# Patient Record
Sex: Female | Born: 1940 | Race: Black or African American | Hispanic: No | Marital: Single | Smoking: Never smoker
Health system: Southern US, Community
[De-identification: ages and names within clinical notes are randomized; demographics above are authoritative.]

## PROBLEM LIST (undated history)

## (undated) DIAGNOSIS — L732 Hidradenitis suppurativa: Secondary | ICD-10-CM

## (undated) DIAGNOSIS — I10 Essential (primary) hypertension: Secondary | ICD-10-CM

## (undated) DIAGNOSIS — K219 Gastro-esophageal reflux disease without esophagitis: Secondary | ICD-10-CM

## (undated) DIAGNOSIS — U071 COVID-19: Secondary | ICD-10-CM

## (undated) DIAGNOSIS — I5032 Chronic diastolic (congestive) heart failure: Secondary | ICD-10-CM

## (undated) DIAGNOSIS — J9621 Acute and chronic respiratory failure with hypoxia: Secondary | ICD-10-CM

## (undated) DIAGNOSIS — D62 Acute posthemorrhagic anemia: Secondary | ICD-10-CM

## (undated) DIAGNOSIS — I2510 Atherosclerotic heart disease of native coronary artery without angina pectoris: Secondary | ICD-10-CM

## (undated) DIAGNOSIS — J8410 Pulmonary fibrosis, unspecified: Secondary | ICD-10-CM

## (undated) DIAGNOSIS — E782 Mixed hyperlipidemia: Secondary | ICD-10-CM

## (undated) HISTORY — DX: Hidradenitis suppurativa: L73.2

## (undated) HISTORY — DX: Essential (primary) hypertension: I10

## (undated) HISTORY — DX: Mixed hyperlipidemia: E78.2

## (undated) HISTORY — DX: Gastro-esophageal reflux disease without esophagitis: K21.9

## (undated) HISTORY — PX: NEPHRECTOMY: SHX65

## (undated) HISTORY — DX: Atherosclerotic heart disease of native coronary artery without angina pectoris: I25.10

## (undated) HISTORY — PX: ABDOMINAL HYSTERECTOMY: SHX81

## (undated) HISTORY — PX: LUMBAR SPINE SURGERY: SHX701

---

## 2007-03-13 ENCOUNTER — Ambulatory Visit (HOSPITAL_COMMUNITY): Admission: RE | Admit: 2007-03-13 | Discharge: 2007-03-13 | Payer: Self-pay | Admitting: Urology

## 2007-06-30 ENCOUNTER — Encounter: Admission: RE | Admit: 2007-06-30 | Discharge: 2007-06-30 | Payer: Self-pay | Admitting: Surgery

## 2007-08-10 ENCOUNTER — Encounter (INDEPENDENT_AMBULATORY_CARE_PROVIDER_SITE_OTHER): Payer: Self-pay | Admitting: Surgery

## 2007-08-10 ENCOUNTER — Ambulatory Visit (HOSPITAL_BASED_OUTPATIENT_CLINIC_OR_DEPARTMENT_OTHER): Admission: RE | Admit: 2007-08-10 | Discharge: 2007-08-10 | Payer: Self-pay | Admitting: Surgery

## 2007-12-07 ENCOUNTER — Ambulatory Visit (HOSPITAL_COMMUNITY): Admission: RE | Admit: 2007-12-07 | Discharge: 2007-12-07 | Payer: Self-pay | Admitting: Emergency Medicine

## 2007-12-07 ENCOUNTER — Emergency Department (HOSPITAL_COMMUNITY): Admission: EM | Admit: 2007-12-07 | Discharge: 2007-12-07 | Payer: Self-pay | Admitting: Emergency Medicine

## 2007-12-09 ENCOUNTER — Ambulatory Visit (HOSPITAL_COMMUNITY): Admission: RE | Admit: 2007-12-09 | Discharge: 2007-12-09 | Payer: Self-pay | Admitting: Orthopedic Surgery

## 2007-12-10 ENCOUNTER — Encounter: Admission: RE | Admit: 2007-12-10 | Discharge: 2007-12-10 | Payer: Self-pay | Admitting: Orthopedic Surgery

## 2007-12-24 ENCOUNTER — Encounter: Admission: RE | Admit: 2007-12-24 | Discharge: 2007-12-24 | Payer: Self-pay | Admitting: Orthopedic Surgery

## 2008-01-07 ENCOUNTER — Inpatient Hospital Stay (HOSPITAL_COMMUNITY): Admission: RE | Admit: 2008-01-07 | Discharge: 2008-01-09 | Payer: Self-pay | Admitting: Otolaryngology

## 2008-03-07 ENCOUNTER — Ambulatory Visit (HOSPITAL_COMMUNITY): Admission: RE | Admit: 2008-03-07 | Discharge: 2008-03-07 | Payer: Self-pay | Admitting: Specialist

## 2008-03-07 ENCOUNTER — Encounter (INDEPENDENT_AMBULATORY_CARE_PROVIDER_SITE_OTHER): Payer: Self-pay | Admitting: Specialist

## 2008-03-07 ENCOUNTER — Ambulatory Visit: Payer: Self-pay | Admitting: Vascular Surgery

## 2010-12-09 ENCOUNTER — Encounter: Payer: Self-pay | Admitting: Urology

## 2011-02-28 ENCOUNTER — Encounter: Payer: Self-pay | Admitting: Cardiology

## 2011-03-19 ENCOUNTER — Encounter: Payer: Self-pay | Admitting: Cardiology

## 2011-03-20 ENCOUNTER — Encounter: Payer: Self-pay | Admitting: Cardiology

## 2011-03-26 ENCOUNTER — Encounter: Payer: Self-pay | Admitting: Cardiology

## 2011-04-02 NOTE — Op Note (Signed)
NAME:  [name], [name] NO.:  [id number]   MEDICAL RECORD NO.:  [id number]          PATIENT TYPE:  OIB   LOCATION:  5019                         FACILITY:  [facility]   PHYSICIAN:  Kerrin Champagne, M.D.   DATE OF BIRTH:  04/26/1941   DATE OF PROCEDURE:  01/07/2008  DATE OF DISCHARGE:                               OPERATIVE REPORT   PREOPERATIVE DIAGNOSIS:  Herniated nucleus pulposus right foraminal  extraforaminal affecting primarily the right L5 nerve root with extruded  fragments.   POSTOPERATIVE DIAGNOSIS:  Right L5 foraminal entrapment with disk  degeneration and direct decompression of extraforaminal herniated  nucleus pulposus.   PROCEDURE:  Right L5 S1 microdiskectomy with partial medial facetectomy  L5 S1 and decompression of the L5 nerve root with diskectomy right L5 S1  foraminal extraforaminal.   SURGEON:  Vira Browns, MD.   ASSISTANT:  Maud Deed, PA-C.   ANESTHESIA:  General via orotracheal intubation, Dr. Diamantina Monks,  supplemented with local infiltration of Marcaine 0.5% with 1:200,000  epinephrine 10 mL.   ESTIMATED BLOOD LOSS:  Less than 25 mL.   DRAINS:  None.   BRIEF CLINICAL HISTORY:  This patient is a 70 year old female with a  greater than 4 week history of back pain.  She indicates her pain began  nearly 4 months ago and has been present since then.  Then with severe  worsening of discomfort into the right lower extremity over the last 3  weeks she underwent extensive evaluation, MRI study which demonstrated  disk protrusions in multiple areas, left L4-5, right L3-4 and right L5  S1.  Foraminal stenosis right L5 S1 with disc protrusion at L3-4.  The  pain pattern is primarily L5, radiation on lateral thigh, lateral calf  and into the right foot with attendant numbness and anesthesia.  Patient  has history of renal carcinoma as well as bladder carcinoma.  She has  been treated by Dr. Annabell Howells.  Also with a history of heart disease,  cardiologist she sees, estimated ejection fraction of 55%.  She is  brought to the operating room after undergoing attempts at conservative  management including epidural steroids x2 without significant relief of  pain.  Did, however, provoke her discomfort and relieve her pain with  ESI via transforaminal approach at the L5 S1 level on the right side.  She is requiring significant amounts of narcotic medicines to relieve  her pain, unable to sleep, unable to stand, ambulate or walk to any  large extent.  She is being brought to the operating room with  recalcitrant pain secondary to foraminal extraforaminal HNP right L5 S1  with right L5 radiculopathy.   DESCRIPTION OF PROCEDURE:  After adequate general anesthesia the patient  was then placed onto the [facility] spine table, all pads were carefully  adjusted and the arms at the sides 90 from the shoulders and the elbow.  She had standard preoperative antibiotics of Ancef, standard preparation  with DuraPrep solution over the midline, was draped in the usual manner.  An incision approximately 2.5 inches in length in the midline at the  expected lumbosacral junction.  The  incision was then carried through  the skin, the subcutaneous layers directly down to the lumbodorsal  fascia in the midline.  Incision then made along the right side of the  spinous process at the expected L5 and S1 level and clamp placed on the  spinous process visible.  The intraoperative lateral radiograph  demonstrated a clamp on the S1 spinous process.  An incision in the  lumbodorsal fascia was then carried up an additional one level on the  right side.  Electrocautery then used to carefully incise the  paraspinous muscles off of the lateral aspect of the spinous process of  L5 and S1 and off the posterior aspect of lamina of L5 and S1 on the  right side.  Boss McCullough retractors was inserted for the blade  lateral and the post medial.  Loop magnification and head  lamp used  during this portion of the procedure.  Leksell rongeur then used to  remove inferior portion of the right side lamina of L5.  This was  removed upwards a good 5 to 6 mm and a 3 mm Kerrison then introduced to  resect further from the inferior aspect from lamina of L5 on the right  side to the area of release of the ligamentum flavum on this side.  A  nerve hook then used to elevate or lift the ligamentum flavum and  ligamentum flavum was then resected using 3 and 4 mm Kerrisons.  The  ligamentum flavum resected off the medial aspect of the facet on the  right side of the expected L5 S1 level.  Then off the superior aspect of  the lamina of S1 on the right side.  Foraminotomy performed over the  right S1 nerve root and the medial aspect of the S1 pedicle identified.  A hockey stick neuroprobe was then passed over the superior aspect of  the pedicle of S1 and intraoperative lateral radial radiograph obtained  demonstrating the hockey stick neuroprobe overlying the posterior aspect  of the disk space of the L5 S1 level above the pedicle of S1 or at the  level of pedicle of S1.  A high speed bur then used to carefully thin  the medial aspect of the inferior articular process of L5, resecting  approximately 25% down to the level of superior articular process of S1.  This was then resected using two 3 mm Kerrisons again over about a 25 to  20% portion of the medial aspect of facet, resecting the reflected  portion of ligament flavum extending superiorly.  The L5 nerve root was  then decompressed within the neuroforamen.  The thecal sac able to be  retracted medially and the 5 nerve root identified superiorly, S1 nerve  root identified inferiorly.  Some veins felt to be tethering the L5  nerve root to the lateral portion of the patient's ventral aspect spinal  canal medial to the S1 pedicle were identified, these were then  carefully lifted with a nerve hook, cauterized using bipolar   electrocautery and then divided with a 15-blade scalpel, releasing the  L5 nerve root and also allowing for identification of the disk at the L5  S1 level on the right side superior to the pedicle of S1.  Carefully  then, examination was carried out of the lateral aspect of the disk, the  neuroforamen noted to be extremely tight continuing out laterally  lateral to the disk at the L5 S1 level.  This being the case, then a 15-  blade  scalpel was used to incise the disk, this was done after first  draping the operating room microscope sterilely and bringing it into the  field.  Under the operating room microscope then we were able to  visualize the neuroforamen quite nicely as well as the disk on the right  side, released the tethering veins that were present and a 15-blade  scalpel used to incise the disk.  The disk was then incised in the  subligamentous region as well as extending out laterally in the angular  region, removing a fair amount of nucleus pulposus material that was  extending laterally.  After completion of the excision of the disk then  further probing of the neuroforamen using a hockey stick neuroprobe as  well as a blunt-tip nerve hook introduced further disk material into the  area of opening annulotomy and further disk material was then resected.  This was continued until hockey stick neuroprobe was able to be passed  along the L5 nerve root and its course without any further sign of nerve  root compression.  The annulotomy in the disk space was at the level of  the superior pedicle of S1.  Irrigation was carried out.  It was felt  that no further decompression could be carried out at this point short  of a full resection of the patient's right-sided L5 S1 facet which was  not necessary as it was felt that the nerve root was well decompressed.  Irrigation was then carried out, thrombin-soaked Gelfoam used to obtain  hemostasis then irrigation.  Soft tissues allowed to  fall back into  place.  The lumbodorsal fascia reapproximated in the midline with  interrupted #0 Vicryl sutures, deep subcu layers approximated with  interrupted #0 and #1 Vicryl, the more superficial layers with  interrupted #2-0 Vicryl sutures and skin closed with a running subcu  stitch of #4-0 Vicryl, Dermabond was applied.  A 4 x 4 was affixed to  the skin with Hypafix tape.  The patient had an area of skin tear that  appeared to be old within the gluteal crease, this measuring about 3 to  3.5 cm in length, showed areas of granulation present that suggested it  to be old.  This was dressed with an OpSite dressing to keep it  isolated from the patient's incision wound as well as from her groin  area.  All instrument, sponge counts were correct.  The patient was then  returned to a supine position, reactive, extubated, returned to the  recovery room in satisfactory condition.      Kerrin Champagne, M.D.  Electronically Signed     JEN/MEDQ  D:  01/07/2008  T:  01/08/2008  Job:  [id number]

## 2011-04-02 NOTE — Op Note (Signed)
NAME:  [name], [name] NO.:  [id number]   MEDICAL RECORD NO.:  [id number]          PATIENT TYPE:  AMB   LOCATION:  DSC                          FACILITY:  [facility]   PHYSICIAN:  Velora Heckler, MD      DATE OF BIRTH:  Apr 15, 1941   DATE OF PROCEDURE:  08/10/2007  DATE OF DISCHARGE:  08/10/2007                               OPERATIVE REPORT   PREOPERATIVE DIAGNOSIS:  Soft tissue mass, right supraclavicular fossa.   POSTOPERATIVE DIAGNOSIS:  Soft tissue mass, right supraclavicular fossa.   PROCEDURE:  Excision, soft tissue mass, right supraclavicular fossa (7 x  5 x 5 cm).   SURGEON:  Velora Heckler, MD, FACS   ANESTHESIA:  General per Zenon Mayo, MD   ESTIMATED BLOOD LOSS:  Minimal.   PREPARATION:  Betadine.   COMPLICATIONS:  None.   INDICATIONS:  The patient is a 70 year old black female from [place],  [place].  She presents with a soft tissue mass of the right  supraclavicular fossa which has been present for approximately 3 years.  However, in recent months it has increased in size significantly and  caused pain radiating into the base of the right neck.  She has noted  some weakness in the right hand.  The patient underwent MRI scan of the  neck at [facility].  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  It was just superior to  the brachial plexus.  She now comes to surgery for excision.   BODY OF REPORT:  Procedure was done in OR #3 at the [facility] surgery  center.  The patient is brought to the operating room, placed in a  supine position on the operating room table.  Following administration  of general anesthesia, the patient is positioned and then prepped and  draped in the usual strict aseptic fashion.  After ascertaining that an  adequate level of anesthesia had been obtained, a curvilinear incision  is made along a skin fold at the base of the right neck.  Incision is  made  for approximately 4 cm in length.  Dissection is carried into the  subcutaneous tissues.  External jugular veins are identified and  preserved.  Dissection is carried through the subcutaneous tissues and  just cephalad to the subclavian vein.  Mass is identified.  There  appears to be a tissue plane, which is easily dissected out with gentle  blunt dissection surrounding the mass.  However, the mass does attach  deeply to either the firs rib or the coracoid process.  With gentle  dissection and judicious use of the electrocautery and medium Ligaclips,  the mass is freed in its entirety and excised.  The entire mass is  submitted to pathology for review.  Hemostasis is obtained within the  wound.  No neurologic structures are encountered.  No significant  vascular structures are divided.  Good hemostasis is noted.  Subcutaneous tissues are closed with interrupted 3-0 Vicryl sutures.  Skin is anesthetized with local Marcaine anesthetic.  Skin is closed  with a running 4-  0 Vicryl subcuticular suture.  Wound is washed and dried and Benzoin and  Steri-Strips are applied.  Sterile dressings are applied.  The patient  is awakened from anesthesia and brought to the recovery room in stable  condition.  The patient tolerated the procedure well.      Velora Heckler, MD  Electronically Signed     TMG/MEDQ  D:  08/10/2007  T:  08/11/2007  Job:  [id number]   cc:   Excell Seltzer. Annabell Howells, M.D.

## 2011-08-09 LAB — DIFFERENTIAL
Eosinophils Relative: 4
Lymphocytes Relative: 40
Lymphs Abs: 1.5
Monocytes Absolute: 0.2
Monocytes Relative: 6

## 2011-08-09 LAB — COMPREHENSIVE METABOLIC PANEL
ALT: 26
AST: 16
Alkaline Phosphatase: 49
CO2: 27
Calcium: 9.2
Potassium: 4.3
Sodium: 141
Total Protein: 6.5

## 2011-08-09 LAB — URINALYSIS, ROUTINE W REFLEX MICROSCOPIC
Glucose, UA: NEGATIVE
Protein, ur: NEGATIVE
Specific Gravity, Urine: 1.029
pH: 6.5

## 2011-08-09 LAB — CBC
MCHC: 33.6
MCV: 89.2
Platelets: 238
WBC: 3.6 — ABNORMAL LOW

## 2011-08-29 LAB — DIFFERENTIAL
Eosinophils Relative: 4
Lymphocytes Relative: 51 — ABNORMAL HIGH
Lymphs Abs: 1.6
Neutrophils Relative %: 38 — ABNORMAL LOW

## 2011-08-29 LAB — BASIC METABOLIC PANEL
Chloride: 106
Creatinine, Ser: 0.83

## 2011-08-29 LAB — URINALYSIS, ROUTINE W REFLEX MICROSCOPIC
Ketones, ur: NEGATIVE
Nitrite: NEGATIVE
Specific Gravity, Urine: 1.02
pH: 5

## 2011-08-29 LAB — POCT HEMOGLOBIN-HEMACUE
Hemoglobin: 11.4 — ABNORMAL LOW
Operator id: 208731

## 2011-08-29 LAB — CBC
HCT: 34.2 — ABNORMAL LOW
Platelets: 227
WBC: 3.1 — ABNORMAL LOW

## 2020-02-02 ENCOUNTER — Other Ambulatory Visit (HOSPITAL_COMMUNITY): Payer: Medicare Other

## 2020-02-02 ENCOUNTER — Ambulatory Visit (HOSPITAL_COMMUNITY)
Admission: AD | Admit: 2020-02-02 | Discharge: 2020-02-02 | Disposition: A | Discharge status: Home / Self Care | Payer: Medicare PPO | Source: Other Acute Inpatient Hospital | Attending: Internal Medicine | Admitting: Internal Medicine

## 2020-02-02 ENCOUNTER — Inpatient Hospital Stay
Admission: RE | Admit: 2020-02-02 | Discharge: 2020-03-13 | Disposition: A | Discharge status: Skilled Nursing Facility | Payer: Medicare Other | Source: Ambulatory Visit | Attending: Internal Medicine | Admitting: Internal Medicine

## 2020-02-02 DIAGNOSIS — J8410 Pulmonary fibrosis, unspecified: Secondary | ICD-10-CM | POA: Diagnosis present

## 2020-02-02 DIAGNOSIS — Z95828 Presence of other vascular implants and grafts: Secondary | ICD-10-CM

## 2020-02-02 DIAGNOSIS — J9690 Respiratory failure, unspecified, unspecified whether with hypoxia or hypercapnia: Secondary | ICD-10-CM | POA: Insufficient documentation

## 2020-02-02 DIAGNOSIS — D62 Acute posthemorrhagic anemia: Secondary | ICD-10-CM | POA: Diagnosis present

## 2020-02-02 DIAGNOSIS — U071 COVID-19: Secondary | ICD-10-CM | POA: Diagnosis present

## 2020-02-02 DIAGNOSIS — R110 Nausea: Secondary | ICD-10-CM

## 2020-02-02 DIAGNOSIS — Z431 Encounter for attention to gastrostomy: Secondary | ICD-10-CM

## 2020-02-02 DIAGNOSIS — I5032 Chronic diastolic (congestive) heart failure: Secondary | ICD-10-CM | POA: Diagnosis present

## 2020-02-02 DIAGNOSIS — K567 Ileus, unspecified: Secondary | ICD-10-CM

## 2020-02-02 DIAGNOSIS — R109 Unspecified abdominal pain: Secondary | ICD-10-CM

## 2020-02-02 DIAGNOSIS — J9621 Acute and chronic respiratory failure with hypoxia: Secondary | ICD-10-CM | POA: Diagnosis present

## 2020-02-02 DIAGNOSIS — Z4659 Encounter for fitting and adjustment of other gastrointestinal appliance and device: Secondary | ICD-10-CM

## 2020-02-02 HISTORY — DX: Acute posthemorrhagic anemia: D62

## 2020-02-02 HISTORY — DX: Acute and chronic respiratory failure with hypoxia: J96.21

## 2020-02-02 HISTORY — DX: Chronic diastolic (congestive) heart failure: I50.32

## 2020-02-02 HISTORY — DX: COVID-19: U07.1

## 2020-02-02 HISTORY — DX: Pulmonary fibrosis, unspecified: J84.10

## 2020-02-03 LAB — CBC
HCT: 28.4 % — ABNORMAL LOW (ref 36.0–46.0)
Hemoglobin: 8.7 g/dL — ABNORMAL LOW (ref 12.0–15.0)
MCH: 28.4 pg (ref 26.0–34.0)
MCHC: 30.6 g/dL (ref 30.0–36.0)
MCV: 92.8 fL (ref 80.0–100.0)
Platelets: 269 10*3/uL (ref 150–400)
RBC: 3.06 MIL/uL — ABNORMAL LOW (ref 3.87–5.11)
RDW: 16.7 % — ABNORMAL HIGH (ref 11.5–15.5)
WBC: 6.1 10*3/uL (ref 4.0–10.5)
nRBC: 0.3 % — ABNORMAL HIGH (ref 0.0–0.2)

## 2020-02-03 LAB — BLOOD GAS, ARTERIAL
Acid-Base Excess: 0.6 mmol/L (ref 0.0–2.0)
Bicarbonate: 24.7 mmol/L (ref 20.0–28.0)
FIO2: 28
O2 Saturation: 97 %
Patient temperature: 37
pCO2 arterial: 39.4 mmHg (ref 32.0–48.0)
pH, Arterial: 7.413 (ref 7.350–7.450)
pO2, Arterial: 83.8 mmHg (ref 83.0–108.0)

## 2020-02-03 LAB — BASIC METABOLIC PANEL
Anion gap: 11 (ref 5–15)
BUN: 20 mg/dL (ref 8–23)
CO2: 23 mmol/L (ref 22–32)
Calcium: 8.7 mg/dL — ABNORMAL LOW (ref 8.9–10.3)
Chloride: 106 mmol/L (ref 98–111)
Creatinine, Ser: 0.91 mg/dL (ref 0.44–1.00)
GFR calc Af Amer: >60 mL/min (ref >60)
GFR calc non Af Amer: >60 mL/min (ref >60)
Glucose, Bld: 132 mg/dL — ABNORMAL HIGH (ref 70–99)
Potassium: 4.1 mmol/L (ref 3.5–5.1)
Sodium: 140 mmol/L (ref 135–145)

## 2020-02-04 ENCOUNTER — Other Ambulatory Visit (HOSPITAL_COMMUNITY): Payer: Medicare Other

## 2020-02-04 LAB — BRAIN NATRIURETIC PEPTIDE: B Natriuretic Peptide: 229.5 pg/mL — ABNORMAL HIGH (ref 0.0–100.0)

## 2020-02-05 ENCOUNTER — Other Ambulatory Visit (HOSPITAL_COMMUNITY): Payer: Medicare Other

## 2020-02-05 ENCOUNTER — Encounter: Payer: Self-pay | Admitting: Internal Medicine

## 2020-02-05 MED ORDER — IOHEXOL 350 MG/ML SOLN
75.0000 mL | Freq: Once | INTRAVENOUS | Status: AC | PRN
  Administered 2020-02-05: 75 mL via INTRAVENOUS

## 2020-02-05 NOTE — Consult Note (Signed)
Referring Physician:  [name] is an 79 y.o. female.                       Chief Complaint: Pulmonary edema  HPI: 79 years old black female from [place], [place] is here for acute on chronic respiratory failure, s/p COVID 19 pneumonia, s/p tracheostomy and congestive heart failure. She has PMH of CAD, s/p stent in coronary artery, essential HTN, GERD, type 2 DM, Hyperlipidemia, stroke, morbid obesity and anemia. Patient denies chest pain. She does not have fever or cough. No known GI or GU bleed. Her CT angio chest is negative for pulmonary embolus.  Past Medical History:  Diagnosis Date  . Coronary atherosclerosis of native coronary artery    Reportedly status post stent placement, details not certain  . Essential hypertension, benign   . GERD (gastroesophageal reflux disease)   . Herpes zoster   . Hidradenitis suppurativa   . Mixed hyperlipidemia       The histories are not reviewed yet. Please review them in the "History" navigator section and refresh this [place].  No family history on file. Social History:  reports that she has never smoked. She has never used smokeless tobacco. She reports that she does not drink alcohol or use drugs.  Allergies: Not on File  No medications prior to admission.    Results for orders placed or performed during the hospital encounter of 02/02/20 (from the past 48 hour(s))  Blood gas, arterial     Status: None   Collection Time: 02/03/20 12:28 PM  Result Value Ref Range   FIO2 28.00    pH, Arterial 7.413 7.350 - 7.450   pCO2 arterial 39.4 32.0 - 48.0 mmHg   pO2, Arterial 83.8 83.0 - 108.0 mmHg   Bicarbonate 24.7 20.0 - 28.0 mmol/L   Acid-Base Excess 0.6 0.0 - 2.0 mmol/L   O2 Saturation 97.0 %   Patient temperature 37.0    Collection site RIGHT RADIAL    Drawn by COLLECTED BY RT     Comment: N.PAYNE RT   Sample type ARTERIAL DRAW    Allens test (pass/fail) PASS PASS    Comment: Performed at [facility], [place] [address], [place], [place] [address]  Brain natriuretic peptide     Status: Abnormal   Collection Time: 02/04/20  4:04 PM  Result Value Ref Range   B Natriuretic Peptide 229.5 (H) 0.0 - 100.0 pg/mL    Comment: Performed at [facility] [address], [place], [place] [address]   CT ANGIO CHEST PE W OR WO CONTRAST  Result Date: 02/05/2020 CLINICAL DATA:  Respiratory failure EXAM: CT ANGIOGRAPHY CHEST WITH CONTRAST TECHNIQUE: Multidetector CT imaging of the chest was performed using the standard protocol during bolus administration of intravenous contrast. Multiplanar CT image reconstructions and MIPs were obtained to evaluate the vascular anatomy. CONTRAST:  79mL OMNIPAQUE IOHEXOL 350 MG/ML SOLN COMPARISON:  None. FINDINGS: Cardiovascular: Contrast injection is sufficient to demonstrate satisfactory opacification of the pulmonary arteries to the segmental level. There is no pulmonary embolus or evidence of right heart strain. The size of the main pulmonary artery is normal. Heart size is normal, with no pericardial effusion. Descending aortic repair. Mild calcific atherosclerosis. Mediastinum/Nodes: No mediastinal, hilar or axillary lymphadenopathy. Normal visualized thyroid. Thoracic esophageal course is normal. Lungs/Pleura: Tracheostomy tube tip at the level of the clavicular heads. There is moderate pulmonary edema. Airways are patent. Upper Abdomen: Contrast bolus timing is not optimized  for evaluation of the abdominal organs. The visualized portions of the organs of the upper abdomen are normal. Musculoskeletal: No chest wall abnormality. No bony spinal canal stenosis. Review of the MIP images confirms the above findings. IMPRESSION: 1. No pulmonary embolus or acute aortic syndrome. 2. Moderate pulmonary edema. Aortic Atherosclerosis (ICD10-I70.0). Electronically Signed   By: Deatra Robinson M.D.   On: 02/05/2020 02:56   DG CHEST PORT 1 VIEW  Result Date: 02/04/2020 CLINICAL DATA:  Respiratory  failure, history coronary artery disease, hypertension EXAM: PORTABLE CHEST 1 VIEW COMPARISON:  Portable exam 1539 hours compared to 02/02/2020 FINDINGS: Tracheostomy tube and nasogastric tube unchanged. RIGHT arm PICC line tip projects over RIGHT atrium. Prior and luminal stenting of the descending thoracic aorta beginning at aortic arch. Enlargement of cardiac silhouette with vascular congestion. BILATERAL pulmonary infiltrates which could represent pulmonary edema or infection. No pleural effusion or pneumothorax. IMPRESSION: Enlargement of cardiac silhouette. Persistent pulmonary infiltrates question pulmonary edema versus multifocal pneumonia. Electronically Signed   By: Ulyses Southward M.D.   On: 02/04/2020 15:47    Review Of Systems Constitutional: No fever, chills, Chronic weight gain. Eyes: No vision change, wears glasses. No discharge or pain. Ears: No hearing loss, No tinnitus. Respiratory: No asthma, COPD, pneumonias. Positive shortness of breath. No hemoptysis. Cardiovascular: No chest pain, palpitation, leg edema. Gastrointestinal: No nausea, vomiting, diarrhea, constipation. No GI bleed. No hepatitis. Genitourinary: No dysuria, hematuria, kidney stone. No incontinance. Neurological: No headache, stroke, seizures.  Psychiatry: No psych facility admission for anxiety, depression, suicide. No detox. Skin: No rash. Musculoskeletal: Positive joint pain,no  fibromyalgia. No neck pain, back pain. Lymphadenopathy: No lymphadenopathy. Hematology: Positive anemia. No easy bruising.   There were no vitals taken for this visit. There is no height or weight on file to calculate BMI. General appearance: alert, cooperative, appears stated age and no distress Head: Normocephalic, atraumatic. Eyes: Brown eyes, Pale pink conjunctiva, corneas clear. PERRL, EOM's intact. Neck: No adenopathy, no carotid bruit, no JVD, supple, symmetrical, trachea midline and thyroid not enlarged. Resp: Clearing to  auscultation bilaterally. Cardio: Regular rate and rhythm, S1, S2 normal, II/VI systolic murmur, no click, rub or gallop GI: Soft, non-tender; bowel sounds normal; no organomegaly. Extremities: Trace edema, no cyanosis or clubbing. Skin: Warm and dry.  Neurologic: Alert and oriented X 3. Right sided weakness.  Assessment/Plan Acute on chronic respiratory failure with hypoxemia Congestive heart failure, compensated CAD S/P COVID 19 pneumonia HTN, essential Morbid obesity GERD Anemia, r/o iron deficiency S/P stroke Type 2 DM  Agree with echocardiogram. Continue diuresis as tolerated.  Time spent: Review of old records, Lab, x-rays, EKG, other cardiac tests, examination, discussion with patient over 70 minutes.  Ricki Rodriguez, MD  02/05/2020, 9:45 AM

## 2020-02-05 NOTE — Progress Notes (Signed)
  Echocardiogram 2D Echocardiogram has been performed.  Gerda Diss 02/05/2020, 3:01 PM

## 2020-02-06 LAB — BASIC METABOLIC PANEL
Anion gap: 12 (ref 5–15)
BUN: 40 mg/dL — ABNORMAL HIGH (ref 8–23)
CO2: 25 mmol/L (ref 22–32)
Calcium: 8.5 mg/dL — ABNORMAL LOW (ref 8.9–10.3)
Chloride: 102 mmol/L (ref 98–111)
Creatinine, Ser: 0.97 mg/dL (ref 0.44–1.00)
GFR calc Af Amer: >60 mL/min (ref >60)
GFR calc non Af Amer: 57 mL/min — ABNORMAL LOW (ref >60)
Glucose, Bld: 142 mg/dL — ABNORMAL HIGH (ref 70–99)
Potassium: 4.4 mmol/L (ref 3.5–5.1)
Sodium: 139 mmol/L (ref 135–145)

## 2020-02-06 NOTE — Consult Note (Signed)
Ref: Susan Heinrich, DO   Subjective:  Respiratory distress continues.   Objective:  Vital Signs in the last 24 hours: BP: 150/68 P: 60, R: 34 on ventilator.  Physical Exam: BP Readings from Last 1 Encounters:  No data found for BP     Wt Readings from Last 1 Encounters:  No data found for Wt    Weight change:  There is no height or weight on file to calculate BMI. HEENT: [place]/AT, Eyes-Brown, Conjunctiva-Pale, Sclera-Non-icteric Neck: No JVD, No bruit, Trachea midline. Lungs:  Rhonchi, Bilateral. Cardiac:  Regular rhythm, normal S1 and S2, no S3. II/VI systolic murmur. Abdomen:  Soft, non-tender. BS present. Extremities:  Trace edema present. No cyanosis. No clubbing. CNS: AxOx3, Cranial nerves grossly intact, moves all 4 extremities but significant right sided weakness..  Skin: Warm and dry.   Intake/Output from previous day: No intake/output data recorded.    Lab Results: BMET    Component Value Date/Time   NA 139 02/06/2020 0502   NA 140 02/03/2020 0701   NA 141 01/07/2008 0834   K 4.4 02/06/2020 0502   K 4.1 02/03/2020 0701   K 4.3 01/07/2008 0834   CL 102 02/06/2020 0502   CL 106 02/03/2020 0701   CL 107 01/07/2008 0834   CO2 25 02/06/2020 0502   CO2 23 02/03/2020 0701   CO2 27 01/07/2008 0834   GLUCOSE 142 (H) 02/06/2020 0502   GLUCOSE 132 (H) 02/03/2020 0701   GLUCOSE 88 01/07/2008 0834   BUN 40 (H) 02/06/2020 0502   BUN 20 02/03/2020 0701   BUN 14 01/07/2008 0834   CREATININE 0.97 02/06/2020 0502   CREATININE 0.91 02/03/2020 0701   CREATININE 0.83 01/07/2008 0834   CALCIUM 8.5 (L) 02/06/2020 0502   CALCIUM 8.7 (L) 02/03/2020 0701   CALCIUM 9.2 01/07/2008 0834   GFRNONAA 57 (L) 02/06/2020 0502   GFRNONAA >60 02/03/2020 0701   GFRNONAA >60 01/07/2008 0834   GFRAA >60 02/06/2020 0502   GFRAA >60 02/03/2020 0701   GFRAA  01/07/2008 0834    >60        The eGFR has been calculated using the MDRD equation. This calculation has not been validated in  all clinical   CBC    Component Value Date/Time   WBC 6.1 02/03/2020 0701   RBC 3.06 (L) 02/03/2020 0701   HGB 8.7 (L) 02/03/2020 0701   HCT 28.4 (L) 02/03/2020 0701   PLT 269 02/03/2020 0701   MCV 92.8 02/03/2020 0701   MCH 28.4 02/03/2020 0701   MCHC 30.6 02/03/2020 0701   RDW 16.7 (H) 02/03/2020 0701   LYMPHSABS 1.5 01/07/2008 0834   MONOABS 0.2 01/07/2008 0834   EOSABS 0.1 01/07/2008 0834   BASOSABS 0.0 01/07/2008 0834   HEPATIC Function Panel No results for input(s): PROT in the last 8760 hours.  Invalid input(s):  ALBUMIN,  AST,  ALT,  ALKPHOS,  BILIDIR,  IBILI HEMOGLOBIN A1C No components found for: HGA1C,  MPG CARDIAC ENZYMES No results found for: CKTOTAL, CKMB, CKMBINDEX, TROPONINI BNP No results for input(s): PROBNP in the last 8760 hours. TSH No results for input(s): TSH in the last 8760 hours. CHOLESTEROL No results for input(s): CHOL in the last 8760 hours.  Scheduled Meds: Continuous Infusions: PRN Meds:.  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  Continue diuresis. Blood cultures  x 2. Consider TEE if bacteremia on cultures.   LOS: 0 days   Time spent including chart review, lab review, examination, discussion with nurse and patient : 30 min   Dixie Dials  MD  02/06/2020, 11:25 AM

## 2020-02-07 LAB — CBC
HCT: 25.3 % — ABNORMAL LOW (ref 36.0–46.0)
Hemoglobin: 7.8 g/dL — ABNORMAL LOW (ref 12.0–15.0)
MCH: 28.6 pg (ref 26.0–34.0)
MCHC: 30.8 g/dL (ref 30.0–36.0)
MCV: 92.7 fL (ref 80.0–100.0)
Platelets: 307 10*3/uL (ref 150–400)
RBC: 2.73 MIL/uL — ABNORMAL LOW (ref 3.87–5.11)
RDW: 16.5 % — ABNORMAL HIGH (ref 11.5–15.5)
WBC: 6.5 10*3/uL (ref 4.0–10.5)
nRBC: 0.5 % — ABNORMAL HIGH (ref 0.0–0.2)

## 2020-02-07 LAB — BASIC METABOLIC PANEL
Anion gap: 13 (ref 5–15)
BUN: 37 mg/dL — ABNORMAL HIGH (ref 8–23)
CO2: 27 mmol/L (ref 22–32)
Calcium: 8.6 mg/dL — ABNORMAL LOW (ref 8.9–10.3)
Chloride: 100 mmol/L (ref 98–111)
Creatinine, Ser: 1.05 mg/dL — ABNORMAL HIGH (ref 0.44–1.00)
GFR calc Af Amer: 60 mL/min — ABNORMAL LOW (ref >60)
GFR calc non Af Amer: 52 mL/min — ABNORMAL LOW (ref >60)
Glucose, Bld: 158 mg/dL — ABNORMAL HIGH (ref 70–99)
Potassium: 4.1 mmol/L (ref 3.5–5.1)
Sodium: 140 mmol/L (ref 135–145)

## 2020-02-09 LAB — BASIC METABOLIC PANEL
Anion gap: 11 (ref 5–15)
BUN: 59 mg/dL — ABNORMAL HIGH (ref 8–23)
CO2: 28 mmol/L (ref 22–32)
Calcium: 8.8 mg/dL — ABNORMAL LOW (ref 8.9–10.3)
Chloride: 104 mmol/L (ref 98–111)
Creatinine, Ser: 1.1 mg/dL — ABNORMAL HIGH (ref 0.44–1.00)
GFR calc Af Amer: 56 mL/min — ABNORMAL LOW (ref >60)
GFR calc non Af Amer: 49 mL/min — ABNORMAL LOW (ref >60)
Glucose, Bld: 130 mg/dL — ABNORMAL HIGH (ref 70–99)
Potassium: 4.3 mmol/L (ref 3.5–5.1)
Sodium: 143 mmol/L (ref 135–145)

## 2020-02-09 LAB — CBC
HCT: 26.7 % — ABNORMAL LOW (ref 36.0–46.0)
Hemoglobin: 8.1 g/dL — ABNORMAL LOW (ref 12.0–15.0)
MCH: 29.1 pg (ref 26.0–34.0)
MCHC: 30.3 g/dL (ref 30.0–36.0)
MCV: 96 fL (ref 80.0–100.0)
Platelets: 284 10*3/uL (ref 150–400)
RBC: 2.78 MIL/uL — ABNORMAL LOW (ref 3.87–5.11)
RDW: 17.2 % — ABNORMAL HIGH (ref 11.5–15.5)
WBC: 3.8 10*3/uL — ABNORMAL LOW (ref 4.0–10.5)
nRBC: 0.8 % — ABNORMAL HIGH (ref 0.0–0.2)

## 2020-02-10 ENCOUNTER — Other Ambulatory Visit (HOSPITAL_COMMUNITY): Payer: Medicare Other

## 2020-02-13 ENCOUNTER — Other Ambulatory Visit (HOSPITAL_COMMUNITY): Payer: Medicare Other

## 2020-02-14 ENCOUNTER — Encounter: Payer: Self-pay | Admitting: Internal Medicine

## 2020-02-14 DIAGNOSIS — J9621 Acute and chronic respiratory failure with hypoxia: Secondary | ICD-10-CM

## 2020-02-14 DIAGNOSIS — D62 Acute posthemorrhagic anemia: Secondary | ICD-10-CM | POA: Diagnosis not present

## 2020-02-14 DIAGNOSIS — I5032 Chronic diastolic (congestive) heart failure: Secondary | ICD-10-CM | POA: Diagnosis not present

## 2020-02-14 DIAGNOSIS — J8410 Pulmonary fibrosis, unspecified: Secondary | ICD-10-CM | POA: Diagnosis present

## 2020-02-14 DIAGNOSIS — U071 COVID-19: Secondary | ICD-10-CM | POA: Diagnosis not present

## 2020-02-14 LAB — CBC
HCT: 29.2 % — ABNORMAL LOW (ref 36.0–46.0)
Hemoglobin: 8.8 g/dL — ABNORMAL LOW (ref 12.0–15.0)
MCH: 29.4 pg (ref 26.0–34.0)
MCHC: 30.1 g/dL (ref 30.0–36.0)
MCV: 97.7 fL (ref 80.0–100.0)
Platelets: 186 10*3/uL (ref 150–400)
RBC: 2.99 MIL/uL — ABNORMAL LOW (ref 3.87–5.11)
RDW: 19 % — ABNORMAL HIGH (ref 11.5–15.5)
WBC: 5.7 10*3/uL (ref 4.0–10.5)
nRBC: 0.3 % — ABNORMAL HIGH (ref 0.0–0.2)

## 2020-02-14 LAB — BASIC METABOLIC PANEL
Anion gap: 11 (ref 5–15)
BUN: 57 mg/dL — ABNORMAL HIGH (ref 8–23)
CO2: 29 mmol/L (ref 22–32)
Calcium: 9 mg/dL (ref 8.9–10.3)
Chloride: 106 mmol/L (ref 98–111)
Creatinine, Ser: 1.16 mg/dL — ABNORMAL HIGH (ref 0.44–1.00)
GFR calc Af Amer: 53 mL/min — ABNORMAL LOW (ref >60)
GFR calc non Af Amer: 46 mL/min — ABNORMAL LOW (ref >60)
Glucose, Bld: 147 mg/dL — ABNORMAL HIGH (ref 70–99)
Potassium: 4 mmol/L (ref 3.5–5.1)
Sodium: 146 mmol/L — ABNORMAL HIGH (ref 135–145)

## 2020-02-14 LAB — MAGNESIUM: Magnesium: 2.9 mg/dL — ABNORMAL HIGH (ref 1.7–2.4)

## 2020-02-14 NOTE — Consult Note (Signed)
Pulmonary [place]  Date of Service: 02/14/2020  PULMONARY CRITICAL CARE CONSULT   [name]  MVE:[id number]  DOB: [birth year]   DOA: 02/02/2020  Referring Physician: Merton Border, MD  HPI: [name] is a 79 y.o. female seen for follow up of Acute on Chronic Respiratory Failure.  Patient has multiple medical problems including GERD history of shingles hyperlipidemia hypertension presented to the hospital with a positive COVID-19 infection.  Patient had been admitted back in February and then discharged however when she went home she had worsening of her respiratory status came back to the hospital and ended up being intubated.  Prior to intubation BiPAP was tried as well as high flow oxygen without success.  CT scan was done which showed no evidence of pulmonary embolism however patient had diffuse fibrotic changes noted on the CT.  Subsequently was not able to come off of the ventilator required extended prolonged mechanical ventilation and eventually ended up with a tracheostomy.  Patient is now on the ventilator is weaning and with a goal of about 16 hours.  Other complications included retention of secretions also with bleeding requiring a bronchoscopy  Review of Systems:  ROS performed and is unremarkable other than noted above.  Past Medical History:  Diagnosis Date  . Coronary atherosclerosis of native coronary artery    Reportedly status post stent placement, details not certain  . Essential hypertension, benign   . GERD (gastroesophageal reflux disease)   . Herpes zoster   . Hidradenitis suppurativa   . Mixed hyperlipidemia     Past surgical history: Tracheostomy  Social History:    reports that she has never smoked. She has never used smokeless tobacco. She reports that she does not drink alcohol or use drugs.  Family History: Non-Contributory to the present  illness  Allergies: Reviewed on the chart  Medications: Reviewed on Rounds  Physical Exam:  Vitals: Temperature 97.8 pulse 67 respiratory rate 24 blood pressure is 143/75 saturations 99%  Ventilator Settings on pressure support FiO2 is 28% tidal volume 429 pressure poor 12 PEEP 5  . General: Comfortable at this time . Eyes: Grossly normal lids, irises & conjunctiva . ENT: grossly tongue is normal . Neck: no obvious mass . Cardiovascular: S1-S2 normal no gallop or rub . Respiratory: No rhonchi or rales are noted . Abdomen: Soft and nontender . Skin: no rash seen on limited exam . Musculoskeletal: not rigid . Psychiatric:unable to assess . Neurologic: no seizure no involuntary movements         Labs on Admission:  Basic Metabolic Panel: Recent Labs  Lab 02/09/20 0550 02/14/20 0631  NA 143 146*  K 4.3 4.0  CL 104 106  CO2 28 29  GLUCOSE 130* 147*  BUN 59* 57*  CREATININE 1.10* 1.16*  CALCIUM 8.8* 9.0  MG  --  2.9*    No results for input(s): PHART, PCO2ART, PO2ART, HCO3, O2SAT in the last 168 hours.  Liver Function Tests: No results for input(s): AST, ALT, ALKPHOS, BILITOT, PROT, ALBUMIN in the last 168 hours. No results for input(s): LIPASE, AMYLASE in the last 168 hours. No results for input(s): AMMONIA in the last 168 hours.  CBC: Recent Labs  Lab 02/09/20 0550 02/14/20 0631  WBC 3.8* 5.7  HGB 8.1* 8.8*  HCT 26.7* 29.2*  MCV 96.0 97.7  PLT 284 186    Cardiac Enzymes: No results for input(s): CKTOTAL, CKMB, CKMBINDEX, TROPONINI in the last 168 hours.  BNP (last 3 results) Recent Labs    02/04/20 1604  BNP 229.5*    ProBNP (last 3 results) No results for input(s): PROBNP in the last 8760 hours.   Radiological Exams on Admission: DG Abd 1 View  Result Date: 02/13/2020 CLINICAL DATA:  Abdominal pain. EXAM: ABDOMEN - 1 VIEW COMPARISON:  Abdominal radiograph 02/10/2020. FINDINGS: Enteric tube projects over the stomach. Surgical clips central  abdomen. Gas is demonstrated within nondilated loops of large and small bowel. Lumbar spine degenerative changes. IMPRESSION: Nonobstructed bowel gas pattern. Electronically Signed   By: Annia Belt M.D.   On: 02/13/2020 10:37    Assessment/Plan Active Problems:   Acute on chronic respiratory failure with hypoxia (HCC)   COVID-19 virus infection   Acute blood loss anemia   Pulmonary fibrosis, postinflammatory (HCC)   Chronic heart failure with preserved ejection fraction (HCC)   1. Acute on chronic respiratory failure with hypoxia patient is doing well at this time continue with the weaning protocol today's goal is for 16 hours on pressure support 12/5 2. COVID-19 virus infection in resolution phase plan is to continue with the supportive care 3. Chronic congestive heart failure preserved ejection fraction continue to monitor fluid status 4. Anemia secondary to blood loss the hemoglobin is 5.7 today she apparently had bleeding prior to admission at the other facility was a some type of blister in the airways will need transfusion today continue with supportive care 5. Pulmonary fibrosis this was noted on CT scan this could very well represent post Covid changes but I do not have a way of accessing outside records will need follow-up after discharge.  I have personally seen and evaluated the patient, evaluated laboratory and imaging results, formulated the assessment and plan and placed orders. The Patient requires high complexity decision making with multiple systems involvement.  Case was discussed on Rounds with the Respiratory Therapy Director and the Respiratory staff Time Spent  Yevonne Pax, MD [facility] Pulmonary Critical Care Medicine Sleep Medicine

## 2020-02-15 DIAGNOSIS — D62 Acute posthemorrhagic anemia: Secondary | ICD-10-CM | POA: Diagnosis not present

## 2020-02-15 DIAGNOSIS — I5032 Chronic diastolic (congestive) heart failure: Secondary | ICD-10-CM | POA: Diagnosis not present

## 2020-02-15 DIAGNOSIS — U071 COVID-19: Secondary | ICD-10-CM | POA: Diagnosis not present

## 2020-02-15 DIAGNOSIS — J9621 Acute and chronic respiratory failure with hypoxia: Secondary | ICD-10-CM | POA: Diagnosis not present

## 2020-02-15 NOTE — Progress Notes (Signed)
Pulmonary Critical Care Medicine [facility] [place]   PULMONARY CRITICAL CARE SERVICE  PROGRESS NOTE  Date of Service: 02/15/2020  [name]  [id number]:[id number]  DOB: Oct 24, 1941   DOA: 02/02/2020  Referring Physician: Carron Curie, MD  HPI: [name] is a 79 y.o. female seen for follow up of Acute on Chronic Respiratory Failure.  Patient at this time is on pressure support wean for goal of 16 hours  Medications: Reviewed on Rounds  Physical Exam:  Vitals: Temperature 96.9 pulse 71 respiratory 18 blood pressure is 117/59 saturations 100%  Ventilator Settings pressure support FiO2 28% pressure support 12/5  . General: Comfortable at this time . Eyes: Grossly normal lids, irises & conjunctiva . ENT: grossly tongue is normal . Neck: no obvious mass . Cardiovascular: S1 S2 normal no gallop . Respiratory: Coarse breath sounds with a few rhonchi . Abdomen: soft . Skin: no rash seen on limited exam . Musculoskeletal: not rigid . Psychiatric:unable to assess . Neurologic: no seizure no involuntary movements         Lab Data:   Basic Metabolic Panel: Recent Labs  Lab 02/09/20 0550 02/14/20 0631  NA 143 146*  K 4.3 4.0  CL 104 106  CO2 28 29  GLUCOSE 130* 147*  BUN 59* 57*  CREATININE 1.10* 1.16*  CALCIUM 8.8* 9.0  MG  --  2.9*    ABG: No results for input(s): PHART, PCO2ART, PO2ART, HCO3, O2SAT in the last 168 hours.  Liver Function Tests: No results for input(s): AST, ALT, ALKPHOS, BILITOT, PROT, ALBUMIN in the last 168 hours. No results for input(s): LIPASE, AMYLASE in the last 168 hours. No results for input(s): AMMONIA in the last 168 hours.  CBC: Recent Labs  Lab 02/09/20 0550 02/14/20 0631  WBC 3.8* 5.7  HGB 8.1* 8.8*  HCT 26.7* 29.2*  MCV 96.0 97.7  PLT 284 186    Cardiac Enzymes: No results for input(s): CKTOTAL, CKMB, CKMBINDEX, TROPONINI in the last 168 hours.  BNP (last 3 results) Recent Labs     02/04/20 1604  BNP 229.5*    ProBNP (last 3 results) No results for input(s): PROBNP in the last 8760 hours.  Radiological Exams: No results found.  Assessment/Plan Active Problems:   Acute on chronic respiratory failure with hypoxia (HCC)   COVID-19 virus infection   Acute blood loss anemia   Pulmonary fibrosis, postinflammatory (HCC)   Chronic heart failure with preserved ejection fraction (HCC)   1. Acute on chronic respiratory failure with hypoxia we will continue to wean on pressure support goal of 16 hours 2. COVID-19 virus infection treated we will continue to follow 3. Acute blood loss anemia no change 4. Pulmonary fibrosis we will continue supportive care 5. Chronic heart failure baseline   I have personally seen and evaluated the patient, evaluated laboratory and imaging results, formulated the assessment and plan and placed orders. The Patient requires high complexity decision making with multiple systems involvement.  Rounds were done with the Respiratory Therapy Director and Staff therapists and discussed with nursing staff also.  Yevonne Pax, MD [facility] Pulmonary Critical Care Medicine Sleep Medicine

## 2020-02-16 DIAGNOSIS — D62 Acute posthemorrhagic anemia: Secondary | ICD-10-CM | POA: Diagnosis not present

## 2020-02-16 DIAGNOSIS — I5032 Chronic diastolic (congestive) heart failure: Secondary | ICD-10-CM | POA: Diagnosis not present

## 2020-02-16 DIAGNOSIS — U071 COVID-19: Secondary | ICD-10-CM | POA: Diagnosis not present

## 2020-02-16 DIAGNOSIS — J9621 Acute and chronic respiratory failure with hypoxia: Secondary | ICD-10-CM | POA: Diagnosis not present

## 2020-02-16 LAB — BASIC METABOLIC PANEL
Anion gap: 12 (ref 5–15)
BUN: 55 mg/dL — ABNORMAL HIGH (ref 8–23)
CO2: 26 mmol/L (ref 22–32)
Calcium: 9 mg/dL (ref 8.9–10.3)
Chloride: 105 mmol/L (ref 98–111)
Creatinine, Ser: 1 mg/dL (ref 0.44–1.00)
GFR calc Af Amer: >60 mL/min (ref >60)
GFR calc non Af Amer: 55 mL/min — ABNORMAL LOW (ref >60)
Glucose, Bld: 136 mg/dL — ABNORMAL HIGH (ref 70–99)
Potassium: 4.1 mmol/L (ref 3.5–5.1)
Sodium: 143 mmol/L (ref 135–145)

## 2020-02-16 NOTE — Progress Notes (Signed)
Pulmonary Critical Care Medicine [facility] [place]   PULMONARY CRITICAL CARE SERVICE  PROGRESS NOTE  Date of Service: 02/16/2020  [name]  [id number]  DOB: 10/08/1941   DOA: 02/02/2020  Referring Physician: Carron Curie, MD  HPI: [name] is a 79 y.o. female seen for follow up of Acute on Chronic Respiratory Failure. Patient remains on the ventilator right now is on full support has been on pressure control mode 28% FiO2.  Medications: Reviewed on Rounds  Physical Exam:  Vitals: Temperature is 96.8 pulse 55 respiratory rate 20 blood pressure is 130/75 saturations 100%  Ventilator Settings mode of ventilation pressure control FiO2 is 28% PEEP 5 inspiratory pressures 18  . General: Comfortable at this time . Eyes: Grossly normal lids, irises & conjunctiva . ENT: grossly tongue is normal . Neck: no obvious mass . Cardiovascular: S1 S2 normal no gallop . Respiratory: Scattered rhonchi expansion is equal . Abdomen: soft . Skin: no rash seen on limited exam . Musculoskeletal: not rigid . Psychiatric:unable to assess . Neurologic: no seizure no involuntary movements         Lab Data:   Basic Metabolic Panel: Recent Labs  Lab 02/14/20 0631 02/16/20 0533  NA 146* 143  K 4.0 4.1  CL 106 105  CO2 29 26  GLUCOSE 147* 136*  BUN 57* 55*  CREATININE 1.16* 1.00  CALCIUM 9.0 9.0  MG 2.9*  --     ABG: No results for input(s): PHART, PCO2ART, PO2ART, HCO3, O2SAT in the last 168 hours.  Liver Function Tests: No results for input(s): AST, ALT, ALKPHOS, BILITOT, PROT, ALBUMIN in the last 168 hours. No results for input(s): LIPASE, AMYLASE in the last 168 hours. No results for input(s): AMMONIA in the last 168 hours.  CBC: Recent Labs  Lab 02/14/20 0631  WBC 5.7  HGB 8.8*  HCT 29.2*  MCV 97.7  PLT 186    Cardiac Enzymes: No results for input(s): CKTOTAL, CKMB, CKMBINDEX, TROPONINI in the last 168 hours.  BNP (last 3  results) Recent Labs    02/04/20 1604  BNP 229.5*    ProBNP (last 3 results) No results for input(s): PROBNP in the last 8760 hours.  Radiological Exams: No results found.  Assessment/Plan Active Problems:   Acute on chronic respiratory failure with hypoxia (HCC)   COVID-19 virus infection   Acute blood loss anemia   Pulmonary fibrosis, postinflammatory (HCC)   Chronic heart failure with preserved ejection fraction (HCC)   1. Acute on chronic respiratory failure with hypoxia we will continue with pressure control mode patient will be attempted today on a wean with possibly doing a 2hour NAG 2. COVID-19 virus infection treated now is in resolution phase 3. Acute blood loss anemia no change we will continue present management 4. Pulmonary fibrosis supportive care follow-up x-rays after discharge 5. Chronic heart failure preserved ejection fraction monitoring fluid status   I have personally seen and evaluated the patient, evaluated laboratory and imaging results, formulated the assessment and plan and placed orders. The Patient requires high complexity decision making with multiple systems involvement.  Rounds were done with the Respiratory Therapy Director and Staff therapists and discussed with nursing staff also.  Yevonne Pax, MD [facility] Pulmonary Critical Care Medicine Sleep Medicine

## 2020-02-17 DIAGNOSIS — U071 COVID-19: Secondary | ICD-10-CM | POA: Diagnosis not present

## 2020-02-17 DIAGNOSIS — D62 Acute posthemorrhagic anemia: Secondary | ICD-10-CM | POA: Diagnosis not present

## 2020-02-17 DIAGNOSIS — I5032 Chronic diastolic (congestive) heart failure: Secondary | ICD-10-CM | POA: Diagnosis not present

## 2020-02-17 DIAGNOSIS — J9621 Acute and chronic respiratory failure with hypoxia: Secondary | ICD-10-CM | POA: Diagnosis not present

## 2020-02-17 LAB — BASIC METABOLIC PANEL
Anion gap: 11 (ref 5–15)
BUN: 47 mg/dL — ABNORMAL HIGH (ref 8–23)
CO2: 29 mmol/L (ref 22–32)
Calcium: 9.3 mg/dL (ref 8.9–10.3)
Chloride: 104 mmol/L (ref 98–111)
Creatinine, Ser: 1.05 mg/dL — ABNORMAL HIGH (ref 0.44–1.00)
GFR calc Af Amer: 60 mL/min — ABNORMAL LOW (ref >60)
GFR calc non Af Amer: 52 mL/min — ABNORMAL LOW (ref >60)
Glucose, Bld: 147 mg/dL — ABNORMAL HIGH (ref 70–99)
Potassium: 4.2 mmol/L (ref 3.5–5.1)
Sodium: 144 mmol/L (ref 135–145)

## 2020-02-17 LAB — CBC
HCT: 29.1 % — ABNORMAL LOW (ref 36.0–46.0)
Hemoglobin: 8.6 g/dL — ABNORMAL LOW (ref 12.0–15.0)
MCH: 29.8 pg (ref 26.0–34.0)
MCHC: 29.6 g/dL — ABNORMAL LOW (ref 30.0–36.0)
MCV: 100.7 fL — ABNORMAL HIGH (ref 80.0–100.0)
Platelets: 256 10*3/uL (ref 150–400)
RBC: 2.89 MIL/uL — ABNORMAL LOW (ref 3.87–5.11)
RDW: 20.1 % — ABNORMAL HIGH (ref 11.5–15.5)
WBC: 4.8 10*3/uL (ref 4.0–10.5)
nRBC: 0.4 % — ABNORMAL HIGH (ref 0.0–0.2)

## 2020-02-17 NOTE — Progress Notes (Signed)
Pulmonary Critical Care Medicine [facility] [place]   PULMONARY CRITICAL CARE SERVICE  PROGRESS NOTE  Date of Service: 02/17/2020  [name]  [id number]  DOB: Nov 10, 1941   DOA: 02/02/2020  Referring Physician: Carron Curie, MD  HPI: [name] is a 79 y.o. female seen for follow up of Acute on Chronic Respiratory Failure.  Patient currently is on the NAG has been on 2 L for a goal of 8 hours  Medications: Reviewed on Rounds  Physical Exam:  Vitals: Temperature is 96.4 pulse 70 respiratory 22 blood pressure is 139/80 saturations 100%  Ventilator Settings off the ventilator on the nag  . General: Comfortable at this time . Eyes: Grossly normal lids, irises & conjunctiva . ENT: grossly tongue is normal . Neck: no obvious mass . Cardiovascular: S1 S2 normal no gallop . Respiratory: Scattered rhonchi expansion is equal . Abdomen: soft . Skin: no rash seen on limited exam . Musculoskeletal: not rigid . Psychiatric:unable to assess . Neurologic: no seizure no involuntary movements         Lab Data:   Basic Metabolic Panel: Recent Labs  Lab 02/14/20 0631 02/16/20 0533 02/17/20 0522  NA 146* 143 144  K 4.0 4.1 4.2  CL 106 105 104  CO2 29 26 29   GLUCOSE 147* 136* 147*  BUN 57* 55* 47*  CREATININE 1.16* 1.00 1.05*  CALCIUM 9.0 9.0 9.3  MG 2.9*  --   --     ABG: No results for input(s): PHART, PCO2ART, PO2ART, HCO3, O2SAT in the last 168 hours.  Liver Function Tests: No results for input(s): AST, ALT, ALKPHOS, BILITOT, PROT, ALBUMIN in the last 168 hours. No results for input(s): LIPASE, AMYLASE in the last 168 hours. No results for input(s): AMMONIA in the last 168 hours.  CBC: Recent Labs  Lab 02/14/20 0631 02/17/20 0522  WBC 5.7 4.8  HGB 8.8* 8.6*  HCT 29.2* 29.1*  MCV 97.7 100.7*  PLT 186 256    Cardiac Enzymes: No results for input(s): CKTOTAL, CKMB, CKMBINDEX, TROPONINI in the last 168 hours.  BNP (last  3 results) Recent Labs    02/04/20 1604  BNP 229.5*    ProBNP (last 3 results) No results for input(s): PROBNP in the last 8760 hours.  Radiological Exams: No results found.  Assessment/Plan Active Problems:   Acute on chronic respiratory failure with hypoxia (HCC)   COVID-19 virus infection   Acute blood loss anemia   Pulmonary fibrosis, postinflammatory (HCC)   Chronic heart failure with preserved ejection fraction (HCC)   1. Acute on chronic respiratory failure with hypoxia with continue with weaning on the NAG today goal is 8 hours 2. COVID-19 virus infection in resolution phase continue supportive care 3. Acute blood loss anemia no change 4. Pulmonary fibrosis at baseline we will continue to monitor 5. Chronic heart failure monitoring fluid status very closely   I have personally seen and evaluated the patient, evaluated laboratory and imaging results, formulated the assessment and plan and placed orders. The Patient requires high complexity decision making with multiple systems involvement.  Rounds were done with the Respiratory Therapy Director and Staff therapists and discussed with nursing staff also.  02/06/20, MD [facility] Pulmonary Critical Care Medicine Sleep Medicine

## 2020-02-18 DIAGNOSIS — D62 Acute posthemorrhagic anemia: Secondary | ICD-10-CM | POA: Diagnosis not present

## 2020-02-18 DIAGNOSIS — U071 COVID-19: Secondary | ICD-10-CM | POA: Diagnosis not present

## 2020-02-18 DIAGNOSIS — I5032 Chronic diastolic (congestive) heart failure: Secondary | ICD-10-CM | POA: Diagnosis not present

## 2020-02-18 DIAGNOSIS — J9621 Acute and chronic respiratory failure with hypoxia: Secondary | ICD-10-CM | POA: Diagnosis not present

## 2020-02-18 NOTE — Progress Notes (Signed)
Pulmonary Critical Care Medicine [facility] [place]   PULMONARY CRITICAL CARE SERVICE  PROGRESS NOTE  Date of Service: 02/18/2020  [name]  [id number]  DOB: Apr 08, 1941   DOA: 02/02/2020  Referring Physician: Carron Curie, MD  HPI: [name] is a 79 y.o. female seen for follow up of Acute on Chronic Respiratory Failure.  Patient at this time is on the NAG has been on 12-hour goal  Medications: Reviewed on Rounds  Physical Exam:  Vitals: Temperature is 97.1 pulse 60 respiratory rate 35 blood pressure is 138/76 saturations 100%  Ventilator Settings on the NAG 12 hours and using 2 L  . General: Comfortable at this time . Eyes: Grossly normal lids, irises & conjunctiva . ENT: grossly tongue is normal . Neck: no obvious mass . Cardiovascular: S1 S2 normal no gallop . Respiratory: Scattered rhonchi expansion is equal . Abdomen: soft . Skin: no rash seen on limited exam . Musculoskeletal: not rigid . Psychiatric:unable to assess . Neurologic: no seizure no involuntary movements         Lab Data:   Basic Metabolic Panel: Recent Labs  Lab 02/14/20 0631 02/16/20 0533 02/17/20 0522  NA 146* 143 144  K 4.0 4.1 4.2  CL 106 105 104  CO2 29 26 29   GLUCOSE 147* 136* 147*  BUN 57* 55* 47*  CREATININE 1.16* 1.00 1.05*  CALCIUM 9.0 9.0 9.3  MG 2.9*  --   --     ABG: No results for input(s): PHART, PCO2ART, PO2ART, HCO3, O2SAT in the last 168 hours.  Liver Function Tests: No results for input(s): AST, ALT, ALKPHOS, BILITOT, PROT, ALBUMIN in the last 168 hours. No results for input(s): LIPASE, AMYLASE in the last 168 hours. No results for input(s): AMMONIA in the last 168 hours.  CBC: Recent Labs  Lab 02/14/20 0631 02/17/20 0522  WBC 5.7 4.8  HGB 8.8* 8.6*  HCT 29.2* 29.1*  MCV 97.7 100.7*  PLT 186 256    Cardiac Enzymes: No results for input(s): CKTOTAL, CKMB, CKMBINDEX, TROPONINI in the last 168 hours.  BNP (last  3 results) Recent Labs    02/04/20 1604  BNP 229.5*    ProBNP (last 3 results) No results for input(s): PROBNP in the last 8760 hours.  Radiological Exams: No results found.  Assessment/Plan Active Problems:   Acute on chronic respiratory failure with hypoxia (HCC)   COVID-19 virus infection   Acute blood loss anemia   Pulmonary fibrosis, postinflammatory (HCC)   Chronic heart failure with preserved ejection fraction (HCC)   1. Acute on chronic respiratory failure with hypoxia we will continue with the NAG on 12-hour goal as indicated 2. COVID-19 virus infection in resolution phase 3. Acute blood loss anemia following labs 4. Pulmonary fibrosis at baseline 5. Chronic heart failure with preserved ejection fraction continue with present management   I have personally seen and evaluated the patient, evaluated laboratory and imaging results, formulated the assessment and plan and placed orders. The Patient requires high complexity decision making with multiple systems involvement.  Rounds were done with the Respiratory Therapy Director and Staff therapists and discussed with nursing staff also.  02/06/20, MD [facility] Pulmonary Critical Care Medicine Sleep Medicine

## 2020-02-19 DIAGNOSIS — U071 COVID-19: Secondary | ICD-10-CM | POA: Diagnosis not present

## 2020-02-19 DIAGNOSIS — D62 Acute posthemorrhagic anemia: Secondary | ICD-10-CM | POA: Diagnosis not present

## 2020-02-19 DIAGNOSIS — I5032 Chronic diastolic (congestive) heart failure: Secondary | ICD-10-CM | POA: Diagnosis not present

## 2020-02-19 DIAGNOSIS — J9621 Acute and chronic respiratory failure with hypoxia: Secondary | ICD-10-CM | POA: Diagnosis not present

## 2020-02-19 NOTE — Progress Notes (Signed)
Pulmonary Critical Care Medicine [facility] [place]   PULMONARY CRITICAL CARE SERVICE  PROGRESS NOTE  Date of Service: 02/19/2020  [name]  OMB:[id number]  DOB: [birth year]   DOA: 02/02/2020  Referring Physician: Carron Curie, MD  HPI: [name] is a 79 y.o. female seen for follow up of Acute on Chronic Respiratory Failure.  Patient currently is on the NAG with a goal of 16 hours doing well requiring only about 2 L of oxygen  Medications: Reviewed on Rounds  Physical Exam:  Vitals: Temperature is 97.4 pulse 54 respiratory 18 blood pressure is 127/75 saturations 100%  Ventilator Settings off the ventilator on the NAG on 16-hour goal  . General: Comfortable at this time . Eyes: Grossly normal lids, irises & conjunctiva . ENT: grossly tongue is normal . Neck: no obvious mass . Cardiovascular: S1 S2 normal no gallop . Respiratory: No rhonchi no rales are noted . Abdomen: soft . Skin: no rash seen on limited exam . Musculoskeletal: not rigid . Psychiatric:unable to assess . Neurologic: no seizure no involuntary movements         Lab Data:   Basic Metabolic Panel: Recent Labs  Lab 02/14/20 0631 02/16/20 0533 02/17/20 0522  NA 146* 143 144  K 4.0 4.1 4.2  CL 106 105 104  CO2 29 26 29   GLUCOSE 147* 136* 147*  BUN 57* 55* 47*  CREATININE 1.16* 1.00 1.05*  CALCIUM 9.0 9.0 9.3  MG 2.9*  --   --     ABG: No results for input(s): PHART, PCO2ART, PO2ART, HCO3, O2SAT in the last 168 hours.  Liver Function Tests: No results for input(s): AST, ALT, ALKPHOS, BILITOT, PROT, ALBUMIN in the last 168 hours. No results for input(s): LIPASE, AMYLASE in the last 168 hours. No results for input(s): AMMONIA in the last 168 hours.  CBC: Recent Labs  Lab 02/14/20 0631 02/17/20 0522  WBC 5.7 4.8  HGB 8.8* 8.6*  HCT 29.2* 29.1*  MCV 97.7 100.7*  PLT 186 256    Cardiac Enzymes: No results for input(s): CKTOTAL, CKMB, CKMBINDEX,  TROPONINI in the last 168 hours.  BNP (last 3 results) Recent Labs    02/04/20 1604  BNP 229.5*    ProBNP (last 3 results) No results for input(s): PROBNP in the last 8760 hours.  Radiological Exams: No results found.  Assessment/Plan Active Problems:   Acute on chronic respiratory failure with hypoxia (HCC)   COVID-19 virus infection   Acute blood loss anemia   Pulmonary fibrosis, postinflammatory (HCC)   Chronic heart failure with preserved ejection fraction (HCC)   1. Acute on chronic respiratory failure with hypoxia we will continue to wean 16-hour goal currently is using 2 L of O2 2. COVID-19 virus infection in resolution phase 3. Acute blood loss anemia monitoring labs 4. Pulmonary fibrosis supportive care 5. Chronic heart failure preserved ejection fraction monitor fluid status   I have personally seen and evaluated the patient, evaluated laboratory and imaging results, formulated the assessment and plan and placed orders. The Patient requires high complexity decision making with multiple systems involvement.  Rounds were done with the Respiratory Therapy Director and Staff therapists and discussed with nursing staff also.  02/06/20, MD [facility] Pulmonary Critical Care Medicine Sleep Medicine

## 2020-02-20 DIAGNOSIS — D62 Acute posthemorrhagic anemia: Secondary | ICD-10-CM | POA: Diagnosis not present

## 2020-02-20 DIAGNOSIS — I5032 Chronic diastolic (congestive) heart failure: Secondary | ICD-10-CM | POA: Diagnosis not present

## 2020-02-20 DIAGNOSIS — U071 COVID-19: Secondary | ICD-10-CM | POA: Diagnosis not present

## 2020-02-20 DIAGNOSIS — J9621 Acute and chronic respiratory failure with hypoxia: Secondary | ICD-10-CM | POA: Diagnosis not present

## 2020-02-20 NOTE — Progress Notes (Signed)
Pulmonary Critical Care Medicine [facility] [place]   PULMONARY CRITICAL CARE SERVICE  PROGRESS NOTE  Date of Service: 02/20/2020  [name]  HDQ:[id number]  DOB: Jun 21, 1941   DOA: 02/02/2020  Referring Physician: Carron Curie, MD  HPI: [name] is a 80 y.o. female seen for follow up of Acute on Chronic Respiratory Failure.  Patient at this time is on the NAG on 2 L with a goal of 20 hours  Medications: Reviewed on Rounds  Physical Exam:  Vitals: Temperature is 98.7 pulse 60 respiratory rate 21 blood pressure is 118/60 saturations 100%  Ventilator Settings off the ventilator on the NAG on 2 L  . General: Comfortable at this time . Eyes: Grossly normal lids, irises & conjunctiva . ENT: grossly tongue is normal . Neck: no obvious mass . Cardiovascular: S1 S2 normal no gallop . Respiratory: No rhonchi coarse breath sounds . Abdomen: soft . Skin: no rash seen on limited exam . Musculoskeletal: not rigid . Psychiatric:unable to assess . Neurologic: no seizure no involuntary movements         Lab Data:   Basic Metabolic Panel: Recent Labs  Lab 02/14/20 0631 02/16/20 0533 02/17/20 0522  NA 146* 143 144  K 4.0 4.1 4.2  CL 106 105 104  CO2 29 26 29   GLUCOSE 147* 136* 147*  BUN 57* 55* 47*  CREATININE 1.16* 1.00 1.05*  CALCIUM 9.0 9.0 9.3  MG 2.9*  --   --     ABG: No results for input(s): PHART, PCO2ART, PO2ART, HCO3, O2SAT in the last 168 hours.  Liver Function Tests: No results for input(s): AST, ALT, ALKPHOS, BILITOT, PROT, ALBUMIN in the last 168 hours. No results for input(s): LIPASE, AMYLASE in the last 168 hours. No results for input(s): AMMONIA in the last 168 hours.  CBC: Recent Labs  Lab 02/14/20 0631 02/17/20 0522  WBC 5.7 4.8  HGB 8.8* 8.6*  HCT 29.2* 29.1*  MCV 97.7 100.7*  PLT 186 256    Cardiac Enzymes: No results for input(s): CKTOTAL, CKMB, CKMBINDEX, TROPONINI in the last 168 hours.  BNP  (last 3 results) Recent Labs    02/04/20 1604  BNP 229.5*    ProBNP (last 3 results) No results for input(s): PROBNP in the last 8760 hours.  Radiological Exams: No results found.  Assessment/Plan Active Problems:   Acute on chronic respiratory failure with hypoxia (HCC)   COVID-19 virus infection   Acute blood loss anemia   Pulmonary fibrosis, postinflammatory (HCC)   Chronic heart failure with preserved ejection fraction (HCC)   1. Acute on chronic respiratory failure with hypoxia we will continue with the NAG titrate oxygen continue pulmonary toilet 2. COVID-19 virus infection at baseline we will continue with supportive care patient has been in resolution phase 3. Acute blood loss anemia no active bleeding 4. Pulmonary fibrosis postinflammatory from worsening with the COVID-19 infection 5. Chronic heart failure preserved ejection fraction monitor fluid status closely   I have personally seen and evaluated the patient, evaluated laboratory and imaging results, formulated the assessment and plan and placed orders. The Patient requires high complexity decision making with multiple systems involvement.  Rounds were done with the Respiratory Therapy Director and Staff therapists and discussed with nursing staff also.  02/06/20, MD [facility] Pulmonary Critical Care Medicine Sleep Medicine

## 2020-02-21 DIAGNOSIS — D62 Acute posthemorrhagic anemia: Secondary | ICD-10-CM | POA: Diagnosis not present

## 2020-02-21 DIAGNOSIS — J9621 Acute and chronic respiratory failure with hypoxia: Secondary | ICD-10-CM | POA: Diagnosis not present

## 2020-02-21 DIAGNOSIS — I5032 Chronic diastolic (congestive) heart failure: Secondary | ICD-10-CM | POA: Diagnosis not present

## 2020-02-21 DIAGNOSIS — U071 COVID-19: Secondary | ICD-10-CM | POA: Diagnosis not present

## 2020-02-21 NOTE — Progress Notes (Signed)
Pulmonary Critical Care Medicine [facility] [place]   PULMONARY CRITICAL CARE SERVICE  PROGRESS NOTE  Date of Service: 02/21/2020  [name]  [id number]  DOB: 12-08-1940   DOA: 02/02/2020  Referring Physician: Carron Curie, MD  HPI: [name] is a 79 y.o. female seen for follow up of Acute on Chronic Respiratory Failure.  Patient currently is on the NAG on 3 L of oxygen  Medications: Reviewed on Rounds  Physical Exam:  Vitals: Temperature is 98.6 pulse 73 respiratory 15 blood pressure is 130/74 saturations 100%  Ventilator Settings off the ventilator on the NAG on 3 L of oxygen  . General: Comfortable at this time . Eyes: Grossly normal lids, irises & conjunctiva . ENT: grossly tongue is normal . Neck: no obvious mass . Cardiovascular: S1 S2 normal no gallop . Respiratory: No rhonchi no rales are noted at this time . Abdomen: soft . Skin: no rash seen on limited exam . Musculoskeletal: not rigid . Psychiatric:unable to assess . Neurologic: no seizure no involuntary movements         Lab Data:   Basic Metabolic Panel: Recent Labs  Lab 02/16/20 0533 02/17/20 0522  NA 143 144  K 4.1 4.2  CL 105 104  CO2 26 29  GLUCOSE 136* 147*  BUN 55* 47*  CREATININE 1.00 1.05*  CALCIUM 9.0 9.3    ABG: No results for input(s): PHART, PCO2ART, PO2ART, HCO3, O2SAT in the last 168 hours.  Liver Function Tests: No results for input(s): AST, ALT, ALKPHOS, BILITOT, PROT, ALBUMIN in the last 168 hours. No results for input(s): LIPASE, AMYLASE in the last 168 hours. No results for input(s): AMMONIA in the last 168 hours.  CBC: Recent Labs  Lab 02/17/20 0522  WBC 4.8  HGB 8.6*  HCT 29.1*  MCV 100.7*  PLT 256    Cardiac Enzymes: No results for input(s): CKTOTAL, CKMB, CKMBINDEX, TROPONINI in the last 168 hours.  BNP (last 3 results) Recent Labs    02/04/20 1604  BNP 229.5*    ProBNP (last 3 results) No results for  input(s): PROBNP in the last 8760 hours.  Radiological Exams: No results found.  Assessment/Plan Active Problems:   Acute on chronic respiratory failure with hypoxia (HCC)   COVID-19 virus infection   Acute blood loss anemia   Pulmonary fibrosis, postinflammatory (HCC)   Chronic heart failure with preserved ejection fraction (HCC)   1. Acute on chronic respiratory failure with hypoxia plan is to continue to wean on the NAG right now on 3 L FiO2 also is tolerating the PMV 2. COVID-19 virus infection resolved 3. Acute blood loss anemia monitoring labs 4. Pulmonary fibrosis severe disease we will need to continue to monitor 5. Chronic heart failure preserved EF following fluid status and labs closely   I have personally seen and evaluated the patient, evaluated laboratory and imaging results, formulated the assessment and plan and placed orders. The Patient requires high complexity decision making with multiple systems involvement.  Rounds were done with the Respiratory Therapy Director and Staff therapists and discussed with nursing staff also.  Yevonne Pax, MD [facility] Pulmonary Critical Care Medicine Sleep Medicine

## 2020-02-22 DIAGNOSIS — I5032 Chronic diastolic (congestive) heart failure: Secondary | ICD-10-CM | POA: Diagnosis not present

## 2020-02-22 DIAGNOSIS — J9621 Acute and chronic respiratory failure with hypoxia: Secondary | ICD-10-CM | POA: Diagnosis not present

## 2020-02-22 DIAGNOSIS — U071 COVID-19: Secondary | ICD-10-CM | POA: Diagnosis not present

## 2020-02-22 DIAGNOSIS — D62 Acute posthemorrhagic anemia: Secondary | ICD-10-CM | POA: Diagnosis not present

## 2020-02-22 NOTE — Progress Notes (Signed)
Pulmonary Critical Care Medicine [facility] [place]   PULMONARY CRITICAL CARE SERVICE  PROGRESS NOTE  Date of Service: 02/22/2020  [name]  [id number]  DOB: Feb 09, 1941   DOA: 02/02/2020  Referring Physician: Carron Curie, MD  HPI: [name] is a 79 y.o. female seen for follow up of Acute on Chronic Respiratory Failure.  At this time patient is on the NAG has been on 2 L of oxygen today will be 48 hours  Medications: Reviewed on Rounds  Physical Exam:  Vitals: Temperature 96.0 pulse 97 respiratory 26 blood pressure is 148/80 saturations 100%  Ventilator Settings on the NAG on 2 L with 48-hour goal  . General: Comfortable at this time . Eyes: Grossly normal lids, irises & conjunctiva . ENT: grossly tongue is normal . Neck: no obvious mass . Cardiovascular: S1 S2 normal no gallop . Respiratory: No rhonchi no rales are noted at this time . Abdomen: soft . Skin: no rash seen on limited exam . Musculoskeletal: not rigid . Psychiatric:unable to assess . Neurologic: no seizure no involuntary movements         Lab Data:   Basic Metabolic Panel: Recent Labs  Lab 02/16/20 0533 02/17/20 0522  NA 143 144  K 4.1 4.2  CL 105 104  CO2 26 29  GLUCOSE 136* 147*  BUN 55* 47*  CREATININE 1.00 1.05*  CALCIUM 9.0 9.3    ABG: No results for input(s): PHART, PCO2ART, PO2ART, HCO3, O2SAT in the last 168 hours.  Liver Function Tests: No results for input(s): AST, ALT, ALKPHOS, BILITOT, PROT, ALBUMIN in the last 168 hours. No results for input(s): LIPASE, AMYLASE in the last 168 hours. No results for input(s): AMMONIA in the last 168 hours.  CBC: Recent Labs  Lab 02/17/20 0522  WBC 4.8  HGB 8.6*  HCT 29.1*  MCV 100.7*  PLT 256    Cardiac Enzymes: No results for input(s): CKTOTAL, CKMB, CKMBINDEX, TROPONINI in the last 168 hours.  BNP (last 3 results) Recent Labs    02/04/20 1604  BNP 229.5*    ProBNP (last 3  results) No results for input(s): PROBNP in the last 8760 hours.  Radiological Exams: No results found.  Assessment/Plan Active Problems:   Acute on chronic respiratory failure with hypoxia (HCC)   COVID-19 virus infection   Acute blood loss anemia   Pulmonary fibrosis, postinflammatory (HCC)   Chronic heart failure with preserved ejection fraction (HCC)   1. Acute on chronic respiratory failure hypoxia continue with weaning on NAG titrate oxygen continue pulmonary toilet. 2. COVID-19 virus infection in resolution phase we will continue with supportive care 3. Acute blood loss anemia no further bleeding is noted 4. Pulmonary fibrosis supportive care 5. Chronic heart failure appears to be compensated monitoring fluid status closely   I have personally seen and evaluated the patient, evaluated laboratory and imaging results, formulated the assessment and plan and placed orders. The Patient requires high complexity decision making with multiple systems involvement.  Rounds were done with the Respiratory Therapy Director and Staff therapists and discussed with nursing staff also.  Yevonne Pax, MD [facility] Pulmonary Critical Care Medicine Sleep Medicine

## 2020-02-23 DIAGNOSIS — J9621 Acute and chronic respiratory failure with hypoxia: Secondary | ICD-10-CM | POA: Diagnosis not present

## 2020-02-23 DIAGNOSIS — D62 Acute posthemorrhagic anemia: Secondary | ICD-10-CM | POA: Diagnosis not present

## 2020-02-23 DIAGNOSIS — J0410 Acute tracheitis without obstruction: Secondary | ICD-10-CM

## 2020-02-23 DIAGNOSIS — J9620 Acute and chronic respiratory failure, unspecified whether with hypoxia or hypercapnia: Secondary | ICD-10-CM | POA: Diagnosis not present

## 2020-02-23 DIAGNOSIS — I5032 Chronic diastolic (congestive) heart failure: Secondary | ICD-10-CM | POA: Diagnosis not present

## 2020-02-23 DIAGNOSIS — U071 COVID-19: Secondary | ICD-10-CM | POA: Diagnosis not present

## 2020-02-23 NOTE — Progress Notes (Addendum)
Pulmonary Critical Care Medicine [facility] [place]   PULMONARY CRITICAL CARE SERVICE  PROGRESS NOTE  Date of Service: 02/23/2020  [name]  [id number]  DOB: June 26, 1941   DOA: 02/02/2020  Referring Physician: Carron Curie, MD  HPI: [name] is a 79 y.o. female seen for follow up of Acute on Chronic Respiratory Failure.  Patient has completed NAG for 48 hours on 2 L.  Satting well no distress.  Medications: Reviewed on Rounds  Physical Exam:  Vitals: Pulse 68 respirations 16 BP 145/85 O2 sat 100% temp 97.0  Ventilator Settings NAG 2 L  . General: Comfortable at this time . Eyes: Grossly normal lids, irises & conjunctiva . ENT: grossly tongue is normal . Neck: no obvious mass . Cardiovascular: S1 S2 normal no gallop . Respiratory: No rales or rhonchi noted . Abdomen: soft . Skin: no rash seen on limited exam . Musculoskeletal: not rigid . Psychiatric:unable to assess . Neurologic: no seizure no involuntary movements         Lab Data:   Basic Metabolic Panel: Recent Labs  Lab 02/17/20 0522  NA 144  K 4.2  CL 104  CO2 29  GLUCOSE 147*  BUN 47*  CREATININE 1.05*  CALCIUM 9.3    ABG: No results for input(s): PHART, PCO2ART, PO2ART, HCO3, O2SAT in the last 168 hours.  Liver Function Tests: No results for input(s): AST, ALT, ALKPHOS, BILITOT, PROT, ALBUMIN in the last 168 hours. No results for input(s): LIPASE, AMYLASE in the last 168 hours. No results for input(s): AMMONIA in the last 168 hours.  CBC: Recent Labs  Lab 02/17/20 0522  WBC 4.8  HGB 8.6*  HCT 29.1*  MCV 100.7*  PLT 256    Cardiac Enzymes: No results for input(s): CKTOTAL, CKMB, CKMBINDEX, TROPONINI in the last 168 hours.  BNP (last 3 results) Recent Labs    02/04/20 1604  BNP 229.5*    ProBNP (last 3 results) No results for input(s): PROBNP in the last 8760 hours.  Radiological Exams: No results found.  Assessment/Plan Active  Problems:   Acute on chronic respiratory failure with hypoxia (HCC)   COVID-19 virus infection   Acute blood loss anemia   Pulmonary fibrosis, postinflammatory (HCC)   Chronic heart failure with preserved ejection fraction (HCC)   1. Acute on chronic respiratory failure hypoxia continue with weaning on NAG titrate oxygen continue pulmonary toilet. 2. COVID-19 virus infection in resolution phase we will continue with supportive care 3. Acute blood loss anemia no further bleeding is noted 4. Pulmonary fibrosis supportive care 5. Chronic heart failure appears to be compensated monitoring fluid status closely   I have personally seen and evaluated the patient, evaluated laboratory and imaging results, formulated the assessment and plan and placed orders. The Patient requires high complexity decision making with multiple systems involvement.  Rounds were done with the Respiratory Therapy Director and Staff therapists and discussed with nursing staff also.  Yevonne Pax, MD [facility] Pulmonary Critical Care Medicine Sleep Medicine

## 2020-02-24 ENCOUNTER — Other Ambulatory Visit (HOSPITAL_COMMUNITY): Payer: Medicare Other

## 2020-02-24 DIAGNOSIS — I5032 Chronic diastolic (congestive) heart failure: Secondary | ICD-10-CM | POA: Diagnosis not present

## 2020-02-24 DIAGNOSIS — D62 Acute posthemorrhagic anemia: Secondary | ICD-10-CM | POA: Diagnosis not present

## 2020-02-24 DIAGNOSIS — J9621 Acute and chronic respiratory failure with hypoxia: Secondary | ICD-10-CM | POA: Diagnosis not present

## 2020-02-24 DIAGNOSIS — U071 COVID-19: Secondary | ICD-10-CM | POA: Diagnosis not present

## 2020-02-24 NOTE — H&P (Signed)
Chief Complaint: Patient was seen in consultation today for percutaneous gastric tube placement at the request of Dr Sharyon Medicus   Supervising Physician: Gilmer Mor  Patient Status: Select IP  History of Present Illness: [name] is a 79 y.o. female   Hx HTN; HLD; DM Covid + 12/2019 Respiratory failure-- led to vent and trach  Doing better Up in bed--- not on vent Dysphagia; poor intake Protein calorie malnutrition Long term NG in place  Request for percutaneous gastric tube placement  Imaging has been reviewed and approved per Dr Odis Luster  Past Medical History:  Diagnosis Date  . Acute blood loss anemia   . Acute on chronic respiratory failure with hypoxia (HCC)   . Chronic heart failure with preserved ejection fraction (HCC)   . Coronary atherosclerosis of native coronary artery    Reportedly status post stent placement, details not certain  . COVID-19 virus infection   . Essential hypertension, benign   . GERD (gastroesophageal reflux disease)   . Herpes zoster   . Hidradenitis suppurativa   . Mixed hyperlipidemia   . Pulmonary fibrosis, postinflammatory (HCC)     Allergies: Patient has no allergy information on record.  Medications: Prior to Admission medications   Not on File     No family history on file.  Social History   Socioeconomic History  . Marital status: Single    Spouse name: Not on file  . Number of children: Not on file  . Years of education: Not on file  . Highest education level: Not on file  Occupational History  . Not on file  Tobacco Use  . Smoking status: Never Smoker  . Smokeless tobacco: Never Used  Substance and Sexual Activity  . Alcohol use: No  . Drug use: No  . Sexual activity: Not on file  Other Topics Concern  . Not on file  Social History Narrative  . Not on file   Social Determinants of Health   Financial Resource Strain:   . Difficulty of Paying Living Expenses:   Food Insecurity:   .  Worried About Programme researcher, broadcasting/film/video in the Last Year:   . Barista in the Last Year:   Transportation Needs:   . Freight forwarder (Medical):   Marland Kitchen Lack of Transportation (Non-Medical):   Physical Activity:   . Days of Exercise per Week:   . Minutes of Exercise per Session:   Stress:   . Feeling of Stress :   Social Connections:   . Frequency of Communication with Friends and Family:   . Frequency of Social Gatherings with Friends and Family:   . Attends Religious Services:   . Active Member of Clubs or Organizations:   . Attends Banker Meetings:   Marland Kitchen Marital Status:      Review of Systems: A 12 point ROS discussed and pertinent positives are indicated in the HPI above.  All other systems are negative.   Vital Signs: There were no vitals taken for this visit.  Physical Exam Vitals reviewed.  Cardiovascular:     Rate and Rhythm: Normal rate and regular rhythm.     Heart sounds: Normal heart sounds.  Pulmonary:     Breath sounds: Normal breath sounds.  Musculoskeletal:     Comments: Moves all 4s  Skin:    General: Skin is warm and dry.  Neurological:     Mental Status: Mental status is at baseline.  Psychiatric:     Comments:  Need to consent with sister Charlene NA-- LM     Imaging: CT ABDOMEN WO CONTRAST  Result Date: 02/24/2020 CLINICAL DATA:  Evaluate and prior to potential percutaneous gastrostomy tube placement. History of pulmonary fibrosis. EXAM: CT ABDOMEN WITHOUT CONTRAST TECHNIQUE: Multidetector CT imaging of the abdomen was performed following the standard protocol without IV contrast. COMPARISON:  Chest CT-02/05/2020 FINDINGS: Lower chest: Evaluation the lower thorax is degraded secondary to patient respiratory artifact. Improved aeration of the lungs with minimal bibasilar interstitial thickening left greater than right. Minimal dependent subpleural ground-glass atelectasis. No pleural effusion. Cardiomegaly. Coronary artery  calcifications. No pericardial effusion. Hepatobiliary: Normal hepatic contour. Normal noncontrast appearance of the gallbladder given degree distention. No radiopaque gallstones. No ascites. Pancreas: Normal noncontrast appearance of the pancreas. Spleen: Normal noncontrast appearance of the spleen. 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 Questionable approximately 2.2 x 1.7 cm hypoattenuating lesion involving the interpolar aspect the left kidney (image 44, series 3), incompletely characterized on this noncontrast examination. Calcifications about the left renal hilum are vascular in etiology. No definite renal stones. Note is made of a left-sided gonadal vein phlebolith. No urinary obstruction or perinephric stranding. Normal appearance the bilateral adrenal glands. The urinary bladder was not imaged. Stomach/Bowel: The stomach is well apposed against the ventral wall of the upper abdomen without interposition of the hepatic parenchyma or transverse colon. Enteric tube tip terminates within the proximal descending duodenum. Moderate colonic stool burden without evidence of enteric obstruction. No pneumoperitoneum, pneumatosis or portal venous gas. Vascular/Lymphatic: Post stent graft repair of the caudal aspect of the descending thoracic aorta. Atherosclerotic plaque within normal caliber abdominal aorta. As above, post right-sided nephrectomy and retroperitoneal lymph node dissection. No bulky retroperitoneal or mesenteric lymphadenopathy on this noncontrast examination. Other: Regional soft tissues appear normal. Musculoskeletal: No acute or aggressive osseous abnormalities. 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 with disc space height loss, endplate irregularity and sclerosis. IMPRESSION: 1. Gastric anatomy amenable to potential percutaneous gastrostomy tube placement as indicated. 2. Improved aeration of the imaged lung bases  with persistent interstitial opacities, left greater than right, nonspecific though compatible with provided history of pulmonary fibrosis. 3. Cardiomegaly.  Coronary artery calcifications. 4. Post endovascular stent repair of the descending thoracic aorta, incompletely evaluated on this noncontrast examination. 5.  Aortic Atherosclerosis (ICD10-I70.0). 6. Questioned approximately 2.2 cm hypoattenuating left-sided renal lesion, incompletely characterized on this noncontrast examination. Further evaluation with renal ultrasound could be performed as indicated. Electronically Signed   By: Sandi Mariscal M.D.   On: 02/24/2020 07:58   DG Abd 1 View  Result Date: 02/13/2020 CLINICAL DATA:  Abdominal pain. EXAM: ABDOMEN - 1 VIEW COMPARISON:  Abdominal radiograph 02/10/2020. FINDINGS: Enteric tube projects over the stomach. Surgical clips central abdomen. Gas is demonstrated within nondilated loops of large and small bowel. Lumbar spine degenerative changes. IMPRESSION: Nonobstructed bowel gas pattern. Electronically Signed   By: Lovey Newcomer M.D.   On: 02/13/2020 10:37   DG Abd 1 View  Result Date: 02/10/2020 CLINICAL DATA:  Nasogastric tube placement EXAM: ABDOMEN - 1 VIEW COMPARISON:  February 02, 2020 FINDINGS: Nasogastric tube tip is at the level of the first portion of the duodenum with the side port essentially at the level of the pylorus. There is no bowel dilatation or air-fluid level to suggest bowel obstruction. No free air. There are multiple clips in the medial right upper to mid abdomen. IMPRESSION: Nasogastric tube tip at level of first portion of  duodenum. No bowel obstruction or free air evident on this supine examination. Electronically Signed   By: Bretta Bang III M.D.   On: 02/10/2020 14:02   CT ANGIO CHEST PE W OR WO CONTRAST  Result Date: 02/05/2020 CLINICAL DATA:  Respiratory failure EXAM: CT ANGIOGRAPHY CHEST WITH CONTRAST TECHNIQUE: Multidetector CT imaging of the chest was performed  using the standard protocol during bolus administration of intravenous contrast. Multiplanar CT image reconstructions and MIPs were obtained to evaluate the vascular anatomy. CONTRAST:  52mL OMNIPAQUE IOHEXOL 350 MG/ML SOLN COMPARISON:  None. FINDINGS: Cardiovascular: Contrast injection is sufficient to demonstrate satisfactory opacification of the pulmonary arteries to the segmental level. There is no pulmonary embolus or evidence of right heart strain. The size of the main pulmonary artery is normal. Heart size is normal, with no pericardial effusion. Descending aortic repair. Mild calcific atherosclerosis. Mediastinum/Nodes: No mediastinal, hilar or axillary lymphadenopathy. Normal visualized thyroid. Thoracic esophageal course is normal. Lungs/Pleura: Tracheostomy tube tip at the level of the clavicular heads. There is moderate pulmonary edema. Airways are patent. Upper Abdomen: Contrast bolus timing is not optimized for evaluation of the abdominal organs. The visualized portions of the organs of the upper abdomen are normal. Musculoskeletal: No chest wall abnormality. No bony spinal canal stenosis. Review of the MIP images confirms the above findings. IMPRESSION: 1. No pulmonary embolus or acute aortic syndrome. 2. Moderate pulmonary edema. Aortic Atherosclerosis (ICD10-I70.0). Electronically Signed   By: Deatra Robinson M.D.   On: 02/05/2020 02:56   DG CHEST PORT 1 VIEW  Result Date: 02/04/2020 CLINICAL DATA:  Respiratory failure, history coronary artery disease, hypertension EXAM: PORTABLE CHEST 1 VIEW COMPARISON:  Portable exam 1539 hours compared to 02/02/2020 FINDINGS: Tracheostomy tube and nasogastric tube unchanged. RIGHT arm PICC line tip projects over RIGHT atrium. Prior and luminal stenting of the descending thoracic aorta beginning at aortic arch. Enlargement of cardiac silhouette with vascular congestion. BILATERAL pulmonary infiltrates which could represent pulmonary edema or infection. No  pleural effusion or pneumothorax. IMPRESSION: Enlargement of cardiac silhouette. Persistent pulmonary infiltrates question pulmonary edema versus multifocal pneumonia. Electronically Signed   By: Ulyses Southward M.D.   On: 02/04/2020 15:47   DG CHEST PORT 1 VIEW  Result Date: 02/02/2020 CLINICAL DATA:  PICC placement EXAM: PORTABLE CHEST 1 VIEW COMPARISON:  Radiograph 01/07/2008, concurrent abdominal radiograph. FINDINGS: Tracheostomy tube terminates in the upper trachea, 7.4 cm from the carina. Transesophageal tube tip and side port distal to the GE junction. Right upper extremity PICC tip terminates at the level of level of the right atrium aortic stent graft repair is noted. Diffuse heterogeneous interstitial and airspace opacities present throughout both lungs more focally confluent in the retrocardiac space. Cardiomegaly is noted, similar increased from comparisons. No acute osseous or soft tissue abnormality. Degenerative changes are present in the imaged spine and shoulders. IMPRESSION: 1. Right upper extremity PICC tip terminates at the level of the right atrium. 2. Tracheostomy tube terminates in the upper trachea, 7.4 cm from the carina. 3. Diffuse heterogeneous interstitial and airspace opacities throughout both lungs more focally confluent in the retrocardiac space. Could reflect edema, infection or ARDS. Electronically Signed   By: Kreg Shropshire M.D.   On: 02/02/2020 23:53   DG Abd Portable 1V  Result Date: 02/02/2020 CLINICAL DATA:  NG placement EXAM: PORTABLE ABDOMEN - 1 VIEW COMPARISON:  None. FINDINGS: Transesophageal tube tip appears positioned at the level of the duodenal bulb with the side port in the gastric antrum. A thoracic aortic stent  is noted. Multiple vascular clips noted along the right margin of the spine. Telemetry leads overlie the chest. Patchy airspace disease and air bronchograms noted in the bases, better detailed on chest radiograph. IMPRESSION: Transesophageal tube tip  appears positioned at the level of the duodenal bulb with the side port in the gastric antrum. Electronically Signed   By: Kreg Shropshire M.D.   On: 02/02/2020 23:51   ECHOCARDIOGRAM COMPLETE  Result Date: 02/05/2020    ECHOCARDIOGRAM REPORT   Patient Name:   JORDI KAMM Date of Exam: 02/05/2020 Medical Rec #:  [id number]                Height: Accession #:    [id number]               Weight: Date of Birth:  06/01/1941               BSA: Patient Age:    78 years                 BP:           136/71 mmHg Patient Gender: F                        HR:           71 bpm. Exam Location:  Inpatient Procedure: 2D Echo, Cardiac Doppler and Color Doppler Indications:     Aortic Stenosis 424.1/I35.0  History:         Patient has no prior history of Echocardiogram examinations.                  CHF, CAD; Risk Factors:Hypertension, Diabetes and Dyslipidemia.                  S/p COIVID-19. Stroke.  Sonographer:     Ross Ludwig RDCS (AE) Referring Phys:  [id number] ALI HIJAZI Diagnosing Phys: Orpah Cobb MD  Sonographer Comments: Patient is morbidly obese. Image acquisition challenging due to patient body habitus and limited mobility. IMPRESSIONS  1. Left ventricular ejection fraction, by estimation, is 60 to 65%. The left ventricle has normal function. The left ventricle has no regional wall motion abnormalities. There is mild concentric left ventricular hypertrophy of the anterior and posterior  segments. Left ventricular diastolic parameters are consistent with Grade I diastolic dysfunction (impaired relaxation).  2. Right ventricular systolic function is normal. The right ventricular size is normal.  3. Left atrial size was mildly dilated.  4. Right atrial size was mildly dilated.  5. The mitral valve is myxomatous. Mild mitral valve regurgitation.  6. Can not r/o AV vegetation on left coronary cusp. The aortic valve is tricuspid. Aortic valve regurgitation is mild.  7. There is mild (Grade II) atheroma plaque involving  the ascending aorta.  8. The inferior vena cava is normal in size with <50% respiratory variability, suggesting right atrial pressure of 8 mmHg. FINDINGS  Left Ventricle: Left ventricular ejection fraction, by estimation, is 60 to 65%. The left ventricle has normal function. The left ventricle has no regional wall motion abnormalities. The left ventricular internal cavity size was normal in size. There is  mild concentric left ventricular hypertrophy of the anterior and posterior segments. Left ventricular diastolic parameters are consistent with Grade I diastolic dysfunction (impaired relaxation). Right Ventricle: The right ventricular size is normal. No increase in right ventricular wall thickness. Right ventricular systolic function is normal. Left Atrium: Left atrial size was mildly dilated. Right  Atrium: Right atrial size was mildly dilated. Pericardium: Trivial pericardial effusion is present. The pericardial effusion is posterior to the left ventricle. There is no evidence of cardiac tamponade. Mitral Valve: The mitral valve is myxomatous. Mild mitral annular calcification. Mild mitral valve regurgitation. Tricuspid Valve: The tricuspid valve is normal in structure. Tricuspid valve regurgitation is mild. Aortic Valve: Can not r/o AV vegetation on left coronary cusp. The aortic valve is tricuspid. . There is mild thickening and mild calcification of the aortic valve. Aortic valve regurgitation is mild. Aortic regurgitation PHT measures 623 msec. Mild aortic valve annular calcification. Aortic valve mean gradient measures 5.0 mmHg. Aortic valve peak gradient measures 10.9 mmHg. Aortic valve area, by VTI measures 1.86 cm. Pulmonic Valve: The pulmonic valve was normal in structure. Pulmonic valve regurgitation is not visualized. Aorta: The aortic root is normal in size and structure. There is mild (Grade II) atheroma plaque involving the ascending aorta. Venous: The inferior vena cava is normal in size with less  than 50% respiratory variability, suggesting right atrial pressure of 8 mmHg. IAS/Shunts: No atrial level shunt detected by color flow Doppler.  LEFT VENTRICLE PLAX 2D LVIDd:         4.40 cm  Diastology LVIDs:         2.60 cm  LV e' lateral:   5.22 cm/s LV PW:         1.50 cm  LV E/e' lateral: 16.2 LV IVS:        1.70 cm  LV e' medial:    3.92 cm/s LVOT diam:     2.00 cm  LV E/e' medial:  21.6 LV SV:         65 LVOT Area:     3.14 cm  RIGHT VENTRICLE             IVC RV Basal diam:  2.40 cm     IVC diam: 1.90 cm RV S prime:     16.10 cm/s TAPSE (M-mode): 2.1 cm LEFT ATRIUM           RIGHT ATRIUM LA diam:      4.60 cm RA Area:     19.10 cm LA Vol (A2C): 34.3 ml RA Volume:   54.50 ml LA Vol (A4C): 74.2 ml  AORTIC VALVE AV Area (Vmax):    1.70 cm AV Area (Vmean):   1.84 cm AV Area (VTI):     1.86 cm AV Vmax:           165.00 cm/s AV Vmean:          107.000 cm/s AV VTI:            0.348 m AV Peak Grad:      10.9 mmHg AV Mean Grad:      5.0 mmHg LVOT Vmax:         89.40 cm/s LVOT Vmean:        62.500 cm/s LVOT VTI:          0.206 m LVOT/AV VTI ratio: 0.59 AI PHT:            623 msec  AORTA Ao Root diam: 3.40 cm Ao Asc diam:  3.80 cm MITRAL VALVE               TRICUSPID VALVE MV Area (PHT): 5.32 cm    TR Peak grad:   24.2 mmHg MV Decel Time: 143 msec    TR Vmax:        246.00 cm/s MV  E velocity: 84.65 cm/s MV A velocity: 81.70 cm/s  SHUNTS MV E/A ratio:  1.04        Systemic VTI:  0.21 m                            Systemic Diam: 2.00 cm Orpah CobbAjay Kadakia MD Electronically signed by Orpah CobbAjay Kadakia MD Signature Date/Time: 02/05/2020/4:19:06 PM    Final     Labs:  CBC: Recent Labs    02/07/20 0714 02/09/20 0550 02/14/20 0631 02/17/20 0522  WBC 6.5 3.8* 5.7 4.8  HGB 7.8* 8.1* 8.8* 8.6*  HCT 25.3* 26.7* 29.2* 29.1*  PLT 307 284 186 256    COAGS: No results for input(s): INR, APTT in the last 8760 hours.  BMP: Recent Labs    02/09/20 0550 02/14/20 0631 02/16/20 0533 02/17/20 0522  NA 143 146* 143 144    K 4.3 4.0 4.1 4.2  CL 104 106 105 104  CO2 28 29 26 29   GLUCOSE 130* 147* 136* 147*  BUN 59* 57* 55* 47*  CALCIUM 8.8* 9.0 9.0 9.3  CREATININE 1.10* 1.16* 1.00 1.05*  GFRNONAA 49* 46* 55* 52*  GFRAA 56* 53* >60 60*    LIVER FUNCTION TESTS: No results for input(s): BILITOT, AST, ALT, ALKPHOS, PROT, ALBUMIN in the last 8760 hours.  TUMOR MARKERS: No results for input(s): AFPTM, CEA, CA199, CHROMGRNA in the last 8760 hours.  Assessment and Plan:  Dysphagia; malnutrition Scheduled for percutaneous gastric tube placement in IR when can We will call for pt when can Will consent with sister Charlene--- NA today-- LM  Thank you for this interesting consult.  I greatly enjoyed meeting Demetrios IsaacsGeraldine Brown-Kirkland and look forward to participating in their care.  A copy of this report was sent to the requesting provider on this date.  Electronically Signed: Robet LeuPamela A Williette Loewe, PA-C 02/24/2020, 11:04 AM   I spent a total of 40 Minutes    in face to face in clinical consultation, greater than 50% of which was counseling/coordinating care for percutaneous gastric tube placement

## 2020-02-24 NOTE — Progress Notes (Signed)
Pulmonary Critical Care Medicine [place]   PULMONARY CRITICAL CARE SERVICE  PROGRESS NOTE  Date of Service: 02/24/2020  [name]  XBJ:[id number]  DOB: July 03, 1941   DOA: 02/02/2020  Referring Physician: Merton Border, MD  HPI: [name] is a 79 y.o. female seen for follow up of Acute on Chronic Respiratory Failure.  Patient is currently on the NAG has been on 2 L of oxygen ready for trach change which will be done today  Medications: Reviewed on Rounds  Physical Exam:  Vitals: Temperature is 96.1 pulse 69 respiratory rate 21 blood pressure is 142/81 saturations 100%  Ventilator Settings on the NAG device on 2 L of O2  . General: Comfortable at this time . Eyes: Grossly normal lids, irises & conjunctiva . ENT: grossly tongue is normal . Neck: no obvious mass . Cardiovascular: S1 S2 normal no gallop . Respiratory: No rhonchi no rales are noted . Abdomen: soft . Skin: no rash seen on limited exam . Musculoskeletal: not rigid . Psychiatric:unable to assess . Neurologic: no seizure no involuntary movements         Lab Data:   Basic Metabolic Panel: No results for input(s): NA, K, CL, CO2, GLUCOSE, BUN, CREATININE, CALCIUM, MG, PHOS in the last 168 hours.  ABG: No results for input(s): PHART, PCO2ART, PO2ART, HCO3, O2SAT in the last 168 hours.  Liver Function Tests: No results for input(s): AST, ALT, ALKPHOS, BILITOT, PROT, ALBUMIN in the last 168 hours. No results for input(s): LIPASE, AMYLASE in the last 168 hours. No results for input(s): AMMONIA in the last 168 hours.  CBC: No results for input(s): WBC, NEUTROABS, HGB, HCT, MCV, PLT in the last 168 hours.  Cardiac Enzymes: No results for input(s): CKTOTAL, CKMB, CKMBINDEX, TROPONINI in the last 168 hours.  BNP (last 3 results) Recent Labs    02/04/20 1604  BNP 229.5*    ProBNP (last 3 results) No results for input(s): PROBNP in the last 8760  hours.  Radiological Exams: CT ABDOMEN WO CONTRAST  Result Date: 02/24/2020 CLINICAL DATA:  Evaluate and prior to potential percutaneous gastrostomy tube placement. History of pulmonary fibrosis. EXAM: CT ABDOMEN WITHOUT CONTRAST TECHNIQUE: Multidetector CT imaging of the abdomen was performed following the standard protocol without IV contrast. COMPARISON:  Chest CT-02/05/2020 FINDINGS: Lower chest: Evaluation the lower thorax is degraded secondary to patient respiratory artifact. Improved aeration of the lungs with minimal bibasilar interstitial thickening left greater than right. Minimal dependent subpleural ground-glass atelectasis. No pleural effusion. Cardiomegaly. Coronary artery calcifications. No pericardial effusion. Hepatobiliary: Normal hepatic contour. Normal noncontrast appearance of the gallbladder given degree distention. No radiopaque gallstones. No ascites. Pancreas: Normal noncontrast appearance of the pancreas. Spleen: Normal noncontrast appearance of the spleen. 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 Questionable approximately 2.2 x 1.7 cm hypoattenuating lesion involving the interpolar aspect the left kidney (image 44, series 3), incompletely characterized on this noncontrast examination. Calcifications about the left renal hilum are vascular in etiology. No definite renal stones. Note is made of a left-sided gonadal vein phlebolith. No urinary obstruction or perinephric stranding. Normal appearance the bilateral adrenal glands. The urinary bladder was not imaged. Stomach/Bowel: The stomach is well apposed against the ventral wall of the upper abdomen without interposition of the hepatic parenchyma or transverse colon. Enteric tube tip terminates within the proximal descending duodenum. Moderate colonic stool burden without evidence of enteric obstruction. No pneumoperitoneum, pneumatosis or portal venous gas. Vascular/Lymphatic: Post  stent graft repair of the  caudal aspect of the descending thoracic aorta. Atherosclerotic plaque within normal caliber abdominal aorta. As above, post right-sided nephrectomy and retroperitoneal lymph node dissection. No bulky retroperitoneal or mesenteric lymphadenopathy on this noncontrast examination. Other: Regional soft tissues appear normal. Musculoskeletal: No acute or aggressive osseous abnormalities. 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 with disc space height loss, endplate irregularity and sclerosis. IMPRESSION: 1. Gastric anatomy amenable to potential percutaneous gastrostomy tube placement as indicated. 2. Improved aeration of the imaged lung bases with persistent interstitial opacities, left greater than right, nonspecific though compatible with provided history of pulmonary fibrosis. 3. Cardiomegaly.  Coronary artery calcifications. 4. Post endovascular stent repair of the descending thoracic aorta, incompletely evaluated on this noncontrast examination. 5.  Aortic Atherosclerosis (ICD10-I70.0). 6. Questioned approximately 2.2 cm hypoattenuating left-sided renal lesion, incompletely characterized on this noncontrast examination. Further evaluation with renal ultrasound could be performed as indicated. Electronically Signed   By: Simonne Come M.D.   On: 02/24/2020 07:58    Assessment/Plan Active Problems:   Acute on chronic respiratory failure with hypoxia (HCC)   COVID-19 virus infection   Acute blood loss anemia   Pulmonary fibrosis, postinflammatory (HCC)   Chronic heart failure with preserved ejection fraction (HCC)   1. Acute on chronic respiratory failure with hypoxia patient currently is on NAG on 2 L we will proceed to changing the tracheostomy out 2. COVID-19 virus infection in resolution phase continue with supportive care 3. Acute blood loss anemia following labs 4. Pulmonary fibrosis advanced disease we will continue with supportive care 5. Chronic  heart failure preserved ejection fraction monitor fluid status closely   I have personally seen and evaluated the patient, evaluated laboratory and imaging results, formulated the assessment and plan and placed orders. The Patient requires high complexity decision making with multiple systems involvement.  Rounds were done with the Respiratory Therapy Director and Staff therapists and discussed with nursing staff also.  Yevonne Pax, MD [facility] Pulmonary Critical Care Medicine Sleep Medicine

## 2020-02-25 DIAGNOSIS — J9621 Acute and chronic respiratory failure with hypoxia: Secondary | ICD-10-CM | POA: Diagnosis not present

## 2020-02-25 DIAGNOSIS — D62 Acute posthemorrhagic anemia: Secondary | ICD-10-CM | POA: Diagnosis not present

## 2020-02-25 DIAGNOSIS — U071 COVID-19: Secondary | ICD-10-CM | POA: Diagnosis not present

## 2020-02-25 DIAGNOSIS — I5032 Chronic diastolic (congestive) heart failure: Secondary | ICD-10-CM | POA: Diagnosis not present

## 2020-02-25 LAB — CBC
HCT: 27.8 % — ABNORMAL LOW (ref 36.0–46.0)
Hemoglobin: 8.3 g/dL — ABNORMAL LOW (ref 12.0–15.0)
MCH: 29.6 pg (ref 26.0–34.0)
MCHC: 29.9 g/dL — ABNORMAL LOW (ref 30.0–36.0)
MCV: 99.3 fL (ref 80.0–100.0)
Platelets: 200 10*3/uL (ref 150–400)
RBC: 2.8 MIL/uL — ABNORMAL LOW (ref 3.87–5.11)
RDW: 19.6 % — ABNORMAL HIGH (ref 11.5–15.5)
WBC: 6.9 10*3/uL (ref 4.0–10.5)
nRBC: 0.3 % — ABNORMAL HIGH (ref 0.0–0.2)

## 2020-02-25 LAB — BASIC METABOLIC PANEL
Anion gap: 14 (ref 5–15)
BUN: 74 mg/dL — ABNORMAL HIGH (ref 8–23)
CO2: 26 mmol/L (ref 22–32)
Calcium: 9.5 mg/dL (ref 8.9–10.3)
Chloride: 107 mmol/L (ref 98–111)
Creatinine, Ser: 1 mg/dL (ref 0.44–1.00)
GFR calc Af Amer: >60 mL/min (ref >60)
GFR calc non Af Amer: 55 mL/min — ABNORMAL LOW (ref >60)
Glucose, Bld: 148 mg/dL — ABNORMAL HIGH (ref 70–99)
Potassium: 4.4 mmol/L (ref 3.5–5.1)
Sodium: 147 mmol/L — ABNORMAL HIGH (ref 135–145)

## 2020-02-25 LAB — PROTIME-INR
INR: 1.1 (ref 0.8–1.2)
Prothrombin Time: 13.9 seconds (ref 11.4–15.2)

## 2020-02-25 NOTE — Progress Notes (Signed)
Pulmonary Critical Care Medicine [place]   PULMONARY CRITICAL CARE SERVICE  PROGRESS NOTE  Date of Service: 02/25/2020  [name]  XHB:[id number]  DOB: 1941/08/21   DOA: 02/02/2020  Referring Physician: Merton Border, MD  HPI: [name] is a 79 y.o. female seen for follow up of Acute on Chronic Respiratory Failure.  Patient is currently on the NAG has been on 2 L flow rate  Medications: Reviewed on Rounds  Physical Exam:  Vitals: Temperature 96.8 pulse 67 respiratory 31 blood pressure is 150/82 saturations 96%  Ventilator Settings off the ventilator on the NAG  . General: Comfortable at this time . Eyes: Grossly normal lids, irises & conjunctiva . ENT: grossly tongue is normal . Neck: no obvious mass . Cardiovascular: S1 S2 normal no gallop . Respiratory: No rhonchi coarse breath sounds . Abdomen: soft . Skin: no rash seen on limited exam . Musculoskeletal: not rigid . Psychiatric:unable to assess . Neurologic: no seizure no involuntary movements         Lab Data:   Basic Metabolic Panel: Recent Labs  Lab 02/25/20 0506  NA 147*  K 4.4  CL 107  CO2 26  GLUCOSE 148*  BUN 74*  CREATININE 1.00  CALCIUM 9.5    ABG: No results for input(s): PHART, PCO2ART, PO2ART, HCO3, O2SAT in the last 168 hours.  Liver Function Tests: No results for input(s): AST, ALT, ALKPHOS, BILITOT, PROT, ALBUMIN in the last 168 hours. No results for input(s): LIPASE, AMYLASE in the last 168 hours. No results for input(s): AMMONIA in the last 168 hours.  CBC: Recent Labs  Lab 02/25/20 0506  WBC 6.9  HGB 8.3*  HCT 27.8*  MCV 99.3  PLT 200    Cardiac Enzymes: No results for input(s): CKTOTAL, CKMB, CKMBINDEX, TROPONINI in the last 168 hours.  BNP (last 3 results) Recent Labs    02/04/20 1604  BNP 229.5*    ProBNP (last 3 results) No results for input(s): PROBNP in the last 8760 hours.  Radiological Exams: CT ABDOMEN  WO CONTRAST  Result Date: 02/24/2020 CLINICAL DATA:  Evaluate and prior to potential percutaneous gastrostomy tube placement. History of pulmonary fibrosis. EXAM: CT ABDOMEN WITHOUT CONTRAST TECHNIQUE: Multidetector CT imaging of the abdomen was performed following the standard protocol without IV contrast. COMPARISON:  Chest CT-02/05/2020 FINDINGS: Lower chest: Evaluation the lower thorax is degraded secondary to patient respiratory artifact. Improved aeration of the lungs with minimal bibasilar interstitial thickening left greater than right. Minimal dependent subpleural ground-glass atelectasis. No pleural effusion. Cardiomegaly. Coronary artery calcifications. No pericardial effusion. Hepatobiliary: Normal hepatic contour. Normal noncontrast appearance of the gallbladder given degree distention. No radiopaque gallstones. No ascites. Pancreas: Normal noncontrast appearance of the pancreas. Spleen: Normal noncontrast appearance of the spleen. 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 Questionable approximately 2.2 x 1.7 cm hypoattenuating lesion involving the interpolar aspect the left kidney (image 44, series 3), incompletely characterized on this noncontrast examination. Calcifications about the left renal hilum are vascular in etiology. No definite renal stones. Note is made of a left-sided gonadal vein phlebolith. No urinary obstruction or perinephric stranding. Normal appearance the bilateral adrenal glands. The urinary bladder was not imaged. Stomach/Bowel: The stomach is well apposed against the ventral wall of the upper abdomen without interposition of the hepatic parenchyma or transverse colon. Enteric tube tip terminates within the proximal descending duodenum. Moderate colonic stool burden without evidence of enteric obstruction. No pneumoperitoneum, pneumatosis or portal venous gas. Vascular/Lymphatic: Post  stent graft repair of the caudal aspect  of the descending thoracic aorta. Atherosclerotic plaque within normal caliber abdominal aorta. As above, post right-sided nephrectomy and retroperitoneal lymph node dissection. No bulky retroperitoneal or mesenteric lymphadenopathy on this noncontrast examination. Other: Regional soft tissues appear normal. Musculoskeletal: No acute or aggressive osseous abnormalities. 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 with disc space height loss, endplate irregularity and sclerosis. IMPRESSION: 1. Gastric anatomy amenable to potential percutaneous gastrostomy tube placement as indicated. 2. Improved aeration of the imaged lung bases with persistent interstitial opacities, left greater than right, nonspecific though compatible with provided history of pulmonary fibrosis. 3. Cardiomegaly.  Coronary artery calcifications. 4. Post endovascular stent repair of the descending thoracic aorta, incompletely evaluated on this noncontrast examination. 5.  Aortic Atherosclerosis (ICD10-I70.0). 6. Questioned approximately 2.2 cm hypoattenuating left-sided renal lesion, incompletely characterized on this noncontrast examination. Further evaluation with renal ultrasound could be performed as indicated. Electronically Signed   By: Simonne Come M.D.   On: 02/24/2020 07:58   US RENAL  Result Date: 02/24/2020 CLINICAL DATA:  Possible 2.2 cm left renal lesion on noncontrast CT. EXAM: RENAL / URINARY TRACT ULTRASOUND COMPLETE COMPARISON:  CT scan earlier today. FINDINGS: Right Kidney: Surgically absent. Left Kidney: Renal measurements: 11.3 x 7.1 x 6.3 cm = volume: 265 mL. Diffusely increased echogenicity. No hydronephrosis. No discrete mass lesion evident by ultrasound. Bladder: Nonvisualized. Other: Study limited by patient body habitus and movement during exam. IMPRESSION: No definite left renal lesion by ultrasound although today's study is degraded by patient body habitus and movement. As the abnormality was  visualized on CT, consider follow-up CT in 3 months to assess stability. Electronically Signed   By: Kennith Center M.D.   On: 02/24/2020 18:12    Assessment/Plan Active Problems:   Acute on chronic respiratory failure with hypoxia (HCC)   COVID-19 virus infection   Acute blood loss anemia   Pulmonary fibrosis, postinflammatory (HCC)   Chronic heart failure with preserved ejection fraction (HCC)   1. Acute on chronic respiratory failure hypoxia plan is to continue with NAG 2 L oxygen try to work towards PMV and capping 2. COVID-19 virus infection resolved we will continue with supportive care 3. Acute blood loss anemia no active bleeding noted 4. Pulmonary fibrosis severe disease 5. Chronic heart failure we will continue to monitor closely monitor fluid status   I have personally seen and evaluated the patient, evaluated laboratory and imaging results, formulated the assessment and plan and placed orders. The Patient requires high complexity decision making with multiple systems involvement.  Rounds were done with the Respiratory Therapy Director and Staff therapists and discussed with nursing staff also.  Yevonne Pax, MD [facility] Pulmonary Critical Care Medicine Sleep Medicine

## 2020-02-26 DIAGNOSIS — J9621 Acute and chronic respiratory failure with hypoxia: Secondary | ICD-10-CM | POA: Diagnosis not present

## 2020-02-26 DIAGNOSIS — D62 Acute posthemorrhagic anemia: Secondary | ICD-10-CM | POA: Diagnosis not present

## 2020-02-26 DIAGNOSIS — U071 COVID-19: Secondary | ICD-10-CM | POA: Diagnosis not present

## 2020-02-26 DIAGNOSIS — I5032 Chronic diastolic (congestive) heart failure: Secondary | ICD-10-CM | POA: Diagnosis not present

## 2020-02-26 NOTE — Progress Notes (Signed)
Pulmonary Critical Care Medicine [facility] [place]   PULMONARY CRITICAL CARE SERVICE  PROGRESS NOTE  Date of Service: 02/26/2020  [name]  EUM:[id number]  DOB: November 30, 1940   DOA: 02/02/2020  Referring Physician: Carron Curie, MD  HPI: [name] is a 79 y.o. female seen for follow up of Acute on Chronic Respiratory Failure.  Currently off the ventilator on the nag patient is requiring 2 L oxygen  Medications: Reviewed on Rounds  Physical Exam:  Vitals: Temperature is 98.0 pulse 60 respiratory 21 blood pressure is 171/68 saturations 99%  Ventilator Settings on NAG on 2 L  . General: Comfortable at this time . Eyes: Grossly normal lids, irises & conjunctiva . ENT: grossly tongue is normal . Neck: no obvious mass . Cardiovascular: S1 S2 normal no gallop . Respiratory: Scattered rhonchi expansion is equal . Abdomen: soft . Skin: no rash seen on limited exam . Musculoskeletal: not rigid . Psychiatric:unable to assess . Neurologic: no seizure no involuntary movements         Lab Data:   Basic Metabolic Panel: Recent Labs  Lab 02/25/20 0506  NA 147*  K 4.4  CL 107  CO2 26  GLUCOSE 148*  BUN 74*  CREATININE 1.00  CALCIUM 9.5    ABG: No results for input(s): PHART, PCO2ART, PO2ART, HCO3, O2SAT in the last 168 hours.  Liver Function Tests: No results for input(s): AST, ALT, ALKPHOS, BILITOT, PROT, ALBUMIN in the last 168 hours. No results for input(s): LIPASE, AMYLASE in the last 168 hours. No results for input(s): AMMONIA in the last 168 hours.  CBC: Recent Labs  Lab 02/25/20 0506  WBC 6.9  HGB 8.3*  HCT 27.8*  MCV 99.3  PLT 200    Cardiac Enzymes: No results for input(s): CKTOTAL, CKMB, CKMBINDEX, TROPONINI in the last 168 hours.  BNP (last 3 results) Recent Labs    02/04/20 1604  BNP 229.5*    ProBNP (last 3 results) No results for input(s): PROBNP in the last 8760 hours.  Radiological Exams: US  RENAL  Result Date: 02/24/2020 CLINICAL DATA:  Possible 2.2 cm left renal lesion on noncontrast CT. EXAM: RENAL / URINARY TRACT ULTRASOUND COMPLETE COMPARISON:  CT scan earlier today. FINDINGS: Right Kidney: Surgically absent. Left Kidney: Renal measurements: 11.3 x 7.1 x 6.3 cm = volume: 265 mL. Diffusely increased echogenicity. No hydronephrosis. No discrete mass lesion evident by ultrasound. Bladder: Nonvisualized. Other: Study limited by patient body habitus and movement during exam. IMPRESSION: No definite left renal lesion by ultrasound although today's study is degraded by patient body habitus and movement. As the abnormality was visualized on CT, consider follow-up CT in 3 months to assess stability. Electronically Signed   By: Kennith Center M.D.   On: 02/24/2020 18:12    Assessment/Plan Active Problems:   Acute on chronic respiratory failure with hypoxia (HCC)   COVID-19 virus infection   Acute blood loss anemia   Pulmonary fibrosis, postinflammatory (HCC)   Chronic heart failure with preserved ejection fraction (HCC)   1. Acute on chronic respiratory failure with hypoxia we will continue with weaning on the NAG on 2 L of O2 2. COVID-19 virus infection treated clinically improving 3. Acute blood loss anemia no sign of active bleeding 4. Pulmonary fibrosis stable at this time advanced disease 5. Chronic heart failure we will continue with supportive care monitor fluid status   I have personally seen and evaluated the patient, evaluated laboratory and imaging results, formulated the assessment and plan and  placed orders. The Patient requires high complexity decision making with multiple systems involvement.  Rounds were done with the Respiratory Therapy Director and Staff therapists and discussed with nursing staff also.  Allyne Gee, MD [facility] Pulmonary Critical Care Medicine Sleep Medicine

## 2020-02-27 DIAGNOSIS — J9621 Acute and chronic respiratory failure with hypoxia: Secondary | ICD-10-CM | POA: Diagnosis not present

## 2020-02-27 DIAGNOSIS — D62 Acute posthemorrhagic anemia: Secondary | ICD-10-CM | POA: Diagnosis not present

## 2020-02-27 DIAGNOSIS — U071 COVID-19: Secondary | ICD-10-CM | POA: Diagnosis not present

## 2020-02-27 DIAGNOSIS — I5032 Chronic diastolic (congestive) heart failure: Secondary | ICD-10-CM | POA: Diagnosis not present

## 2020-02-27 NOTE — Progress Notes (Signed)
Pulmonary Critical Care Medicine [facility] [place]   PULMONARY CRITICAL CARE SERVICE  PROGRESS NOTE  Date of Service: 02/27/2020  [name]  TOI:[id number]  DOB: March 07, 1941   DOA: 02/02/2020  Referring Physician: Carron Curie, MD  HPI: [name] is a 79 y.o. female seen for follow up of Acute on Chronic Respiratory Failure.  Patient is capping currently on 2 L good saturations today will be 24 hours  Medications: Reviewed on Rounds  Physical Exam:  Vitals: Temperature is 96.6 pulse 65 respiratory 25 blood pressure is 158/88 saturations 99%  Ventilator Settings capping 2 L  . General: Comfortable at this time . Eyes: Grossly normal lids, irises & conjunctiva . ENT: grossly tongue is normal . Neck: no obvious mass . Cardiovascular: S1 S2 normal no gallop . Respiratory: No rhonchi coarse breath sounds . Abdomen: soft . Skin: no rash seen on limited exam . Musculoskeletal: not rigid . Psychiatric:unable to assess . Neurologic: no seizure no involuntary movements         Lab Data:   Basic Metabolic Panel: Recent Labs  Lab 02/25/20 0506  NA 147*  K 4.4  CL 107  CO2 26  GLUCOSE 148*  BUN 74*  CREATININE 1.00  CALCIUM 9.5    ABG: No results for input(s): PHART, PCO2ART, PO2ART, HCO3, O2SAT in the last 168 hours.  Liver Function Tests: No results for input(s): AST, ALT, ALKPHOS, BILITOT, PROT, ALBUMIN in the last 168 hours. No results for input(s): LIPASE, AMYLASE in the last 168 hours. No results for input(s): AMMONIA in the last 168 hours.  CBC: Recent Labs  Lab 02/25/20 0506  WBC 6.9  HGB 8.3*  HCT 27.8*  MCV 99.3  PLT 200    Cardiac Enzymes: No results for input(s): CKTOTAL, CKMB, CKMBINDEX, TROPONINI in the last 168 hours.  BNP (last 3 results) Recent Labs    02/04/20 1604  BNP 229.5*    ProBNP (last 3 results) No results for input(s): PROBNP in the last 8760 hours.  Radiological Exams: No  results found.  Assessment/Plan Active Problems:   Acute on chronic respiratory failure with hypoxia (HCC)   COVID-19 virus infection   Acute blood loss anemia   Pulmonary fibrosis, postinflammatory (HCC)   Chronic heart failure with preserved ejection fraction (HCC)   1. Acute on chronic respiratory failure hypoxia plan is to continue with capping per wean protocol 2. COVID-19 virus infection resolved 3. Acute blood loss anemia no active bleeding 4. Pulmonary fibrosis we will continue with supportive care 5. Chronic heart failure we will continue present management   I have personally seen and evaluated the patient, evaluated laboratory and imaging results, formulated the assessment and plan and placed orders. The Patient requires high complexity decision making with multiple systems involvement.  Rounds were done with the Respiratory Therapy Director and Staff therapists and discussed with nursing staff also.  Yevonne Pax, MD [facility] Pulmonary Critical Care Medicine Sleep Medicine

## 2020-02-28 ENCOUNTER — Other Ambulatory Visit (HOSPITAL_COMMUNITY): Payer: Medicare Other

## 2020-02-28 DIAGNOSIS — I5032 Chronic diastolic (congestive) heart failure: Secondary | ICD-10-CM | POA: Diagnosis not present

## 2020-02-28 DIAGNOSIS — D62 Acute posthemorrhagic anemia: Secondary | ICD-10-CM | POA: Diagnosis not present

## 2020-02-28 DIAGNOSIS — J9621 Acute and chronic respiratory failure with hypoxia: Secondary | ICD-10-CM | POA: Diagnosis not present

## 2020-02-28 DIAGNOSIS — U071 COVID-19: Secondary | ICD-10-CM | POA: Diagnosis not present

## 2020-02-28 NOTE — Progress Notes (Signed)
Patient ID: [name], female   DOB: [birth year], 79 y.o.   MRN: [id number]   Pt is still scheduled in IR for Percutaneous gastric tube placement asap.  National shortage of G tubes We will place orders and move forward as quickly as possible when new shipment arrives  So sorry for this inconvenience

## 2020-02-28 NOTE — Progress Notes (Signed)
Pulmonary Critical Care Medicine [facility] [place]   PULMONARY CRITICAL CARE SERVICE  PROGRESS NOTE  Date of Service: 02/28/2020  Ahna Konkle  JEH:[id number]  DOB: [birth year]   DOA: 02/02/2020  Referring Physician: Carron Curie, MD  HPI: [name] is a 79 y.o. female seen for follow up of Acute on Chronic Respiratory Failure.  Patient at this time is on the NAG has been on room air  Medications: Reviewed on Rounds  Physical Exam:  Vitals: Temperature is 97.6 pulse 71 respiratory rate is 24 blood pressure 156/79 saturations 95%  Ventilator Settings off the ventilator on the NAG  . General: Comfortable at this time . Eyes: Grossly normal lids, irises & conjunctiva . ENT: grossly tongue is normal . Neck: no obvious mass . Cardiovascular: S1 S2 normal no gallop . Respiratory: Scattered rhonchi expansion is equal . Abdomen: soft . Skin: no rash seen on limited exam . Musculoskeletal: not rigid . Psychiatric:unable to assess . Neurologic: no seizure no involuntary movements         Lab Data:   Basic Metabolic Panel: Recent Labs  Lab 02/25/20 0506  NA 147*  K 4.4  CL 107  CO2 26  GLUCOSE 148*  BUN 74*  CREATININE 1.00  CALCIUM 9.5    ABG: No results for input(s): PHART, PCO2ART, PO2ART, HCO3, O2SAT in the last 168 hours.  Liver Function Tests: No results for input(s): AST, ALT, ALKPHOS, BILITOT, PROT, ALBUMIN in the last 168 hours. No results for input(s): LIPASE, AMYLASE in the last 168 hours. No results for input(s): AMMONIA in the last 168 hours.  CBC: Recent Labs  Lab 02/25/20 0506  WBC 6.9  HGB 8.3*  HCT 27.8*  MCV 99.3  PLT 200    Cardiac Enzymes: No results for input(s): CKTOTAL, CKMB, CKMBINDEX, TROPONINI in the last 168 hours.  BNP (last 3 results) Recent Labs    02/04/20 1604  BNP 229.5*    ProBNP (last 3 results) No results for input(s): PROBNP in the last 8760 hours.  Radiological  Exams: No results found.  Assessment/Plan Active Problems:   Acute on chronic respiratory failure with hypoxia (HCC)   COVID-19 virus infection   Acute blood loss anemia   Pulmonary fibrosis, postinflammatory (HCC)   Chronic heart failure with preserved ejection fraction (HCC)   1. Acute on chronic respiratory failure hypoxia patient is off the vent on the NAG plan is to continue with secretions are still significant needing ongoing pulmonary toilet 2. COVID-19 virus infection in resolution phase 3. Acute blood loss anemia stable patient scheduled for PEG 4. Pulmonary fibrosis advanced disease we will continue to follow 5. Chronic heart failure preserved ejection fraction continue with supportive care   I have personally seen and evaluated the patient, evaluated laboratory and imaging results, formulated the assessment and plan and placed orders. The Patient requires high complexity decision making with multiple systems involvement.  Rounds were done with the Respiratory Therapy Director and Staff therapists and discussed with nursing staff also.  Yevonne Pax, MD [facility] Pulmonary Critical Care Medicine Sleep Medicine

## 2020-02-29 DIAGNOSIS — I5032 Chronic diastolic (congestive) heart failure: Secondary | ICD-10-CM | POA: Diagnosis not present

## 2020-02-29 DIAGNOSIS — J9621 Acute and chronic respiratory failure with hypoxia: Secondary | ICD-10-CM | POA: Diagnosis not present

## 2020-02-29 DIAGNOSIS — U071 COVID-19: Secondary | ICD-10-CM | POA: Diagnosis not present

## 2020-02-29 DIAGNOSIS — D62 Acute posthemorrhagic anemia: Secondary | ICD-10-CM | POA: Diagnosis not present

## 2020-02-29 NOTE — Progress Notes (Signed)
Pulmonary Critical Care Medicine [facility] [place]   PULMONARY CRITICAL CARE SERVICE  PROGRESS NOTE  Date of Service: 02/29/2020  [name]  DJM:[id number]  DOB: [birth year]   DOA: 02/02/2020  Referring Physician: Carron Curie, MD  HPI: [name] is a 79 y.o. female seen for follow up of Acute on Chronic Respiratory Failure.  Patient currently is on the NAG she is not been tolerating capping and respiratory therapy notes that there is increased work of breathing.  I suggested that we try to downsize to a #4 trach that she may have a small airway which is interfering with ability to To do capping  Medications: Reviewed on Rounds  Physical Exam:  Vitals: Temperature is 97.5 pulse 65 respiratory rate 30 blood pressure is 135/57 saturations 97%  Ventilator Settings on the NAG  . General: Comfortable at this time . Eyes: Grossly normal lids, irises & conjunctiva . ENT: grossly tongue is normal . Neck: no obvious mass . Cardiovascular: S1 S2 normal no gallop . Respiratory: No rhonchi no rales are noted . Abdomen: soft . Skin: no rash seen on limited exam . Musculoskeletal: not rigid . Psychiatric:unable to assess . Neurologic: no seizure no involuntary movements         Lab Data:   Basic Metabolic Panel: Recent Labs  Lab 02/25/20 0506  NA 147*  K 4.4  CL 107  CO2 26  GLUCOSE 148*  BUN 74*  CREATININE 1.00  CALCIUM 9.5    ABG: No results for input(s): PHART, PCO2ART, PO2ART, HCO3, O2SAT in the last 168 hours.  Liver Function Tests: No results for input(s): AST, ALT, ALKPHOS, BILITOT, PROT, ALBUMIN in the last 168 hours. No results for input(s): LIPASE, AMYLASE in the last 168 hours. No results for input(s): AMMONIA in the last 168 hours.  CBC: Recent Labs  Lab 02/25/20 0506  WBC 6.9  HGB 8.3*  HCT 27.8*  MCV 99.3  PLT 200    Cardiac Enzymes: No results for input(s): CKTOTAL, CKMB, CKMBINDEX, TROPONINI in the  last 168 hours.  BNP (last 3 results) Recent Labs    02/04/20 1604  BNP 229.5*    ProBNP (last 3 results) No results for input(s): PROBNP in the last 8760 hours.  Radiological Exams: No results found.  Assessment/Plan Active Problems:   Acute on chronic respiratory failure with hypoxia (HCC)   COVID-19 virus infection   Acute blood loss anemia   Pulmonary fibrosis, postinflammatory (HCC)   Chronic heart failure with preserved ejection fraction (HCC)   1. Acute on chronic respiratory failure with hypoxia we will downsized to a #trach trying to capping trials 2. COVID-19 virus infection resolved 3. Acute blood loss anemia no change we will continue with supportive care 4. Pulmonary fibrosis at baseline advanced disease 5. Chronic heart failure with preserved EF monitoring fluid status   I have personally seen and evaluated the patient, evaluated laboratory and imaging results, formulated the assessment and plan and placed orders. The Patient requires high complexity decision making with multiple systems involvement.  Rounds were done with the Respiratory Therapy Director and Staff therapists and discussed with nursing staff also.  Yevonne Pax, MD [facility] Pulmonary Critical Care Medicine Sleep Medicine

## 2020-03-01 DIAGNOSIS — J9621 Acute and chronic respiratory failure with hypoxia: Secondary | ICD-10-CM | POA: Diagnosis not present

## 2020-03-01 DIAGNOSIS — U071 COVID-19: Secondary | ICD-10-CM | POA: Diagnosis not present

## 2020-03-01 DIAGNOSIS — I5032 Chronic diastolic (congestive) heart failure: Secondary | ICD-10-CM | POA: Diagnosis not present

## 2020-03-01 DIAGNOSIS — D62 Acute posthemorrhagic anemia: Secondary | ICD-10-CM | POA: Diagnosis not present

## 2020-03-01 NOTE — Progress Notes (Signed)
Pulmonary Critical Care Medicine [facility] [place]   PULMONARY CRITICAL CARE SERVICE  PROGRESS NOTE  Date of Service: 03/01/2020  [name]  WVP:[id number]  DOB: [birth year]   DOA: 02/02/2020  Referring Physician: Carron Curie, MD  HPI: [name] is a 79 y.o. female seen for follow up of Acute on Chronic Respiratory Failure.  Patient right now is on the NAG has been on room air doing fairly well  Medications: Reviewed on Rounds  Physical Exam:  Vitals: Temperature is 98.7 pulse 56 respiratory 26 blood pressure is 144/84 saturations 98%  Ventilator Settings on the NAG on room air  . General: Comfortable at this time . Eyes: Grossly normal lids, irises & conjunctiva . ENT: grossly tongue is normal . Neck: no obvious mass . Cardiovascular: S1 S2 normal no gallop . Respiratory: No rhonchi no rales . Abdomen: soft . Skin: no rash seen on limited exam . Musculoskeletal: not rigid . Psychiatric:unable to assess . Neurologic: no seizure no involuntary movements         Lab Data:   Basic Metabolic Panel: Recent Labs  Lab 02/25/20 0506  NA 147*  K 4.4  CL 107  CO2 26  GLUCOSE 148*  BUN 74*  CREATININE 1.00  CALCIUM 9.5    ABG: No results for input(s): PHART, PCO2ART, PO2ART, HCO3, O2SAT in the last 168 hours.  Liver Function Tests: No results for input(s): AST, ALT, ALKPHOS, BILITOT, PROT, ALBUMIN in the last 168 hours. No results for input(s): LIPASE, AMYLASE in the last 168 hours. No results for input(s): AMMONIA in the last 168 hours.  CBC: Recent Labs  Lab 02/25/20 0506  WBC 6.9  HGB 8.3*  HCT 27.8*  MCV 99.3  PLT 200    Cardiac Enzymes: No results for input(s): CKTOTAL, CKMB, CKMBINDEX, TROPONINI in the last 168 hours.  BNP (last 3 results) Recent Labs    02/04/20 1604  BNP 229.5*    ProBNP (last 3 results) No results for input(s): PROBNP in the last 8760 hours.  Radiological Exams: No results  found.  Assessment/Plan Active Problems:   Acute on chronic respiratory failure with hypoxia (HCC)   COVID-19 virus infection   Acute blood loss anemia   Pulmonary fibrosis, postinflammatory (HCC)   Chronic heart failure with preserved ejection fraction (HCC)   1. Acute on chronic respiratory failure with hypoxia she has been downsized to a #4 cuffless trach and now is on the NAG and PMV was placed back on she initially did not tolerated but after racemic epi seems to be doing better 2. COVID-19 virus infection resolved 3. Acute blood loss anemia no active bleeding 4. Pulmonary fibrosis we will continue to monitor 5. Chronic heart failure at baseline we will continue with supportive care   I have personally seen and evaluated the patient, evaluated laboratory and imaging results, formulated the assessment and plan and placed orders. The Patient requires high complexity decision making with multiple systems involvement.  Rounds were done with the Respiratory Therapy Director and Staff therapists and discussed with nursing staff also.  Yevonne Pax, MD [facility] Pulmonary Critical Care Medicine Sleep Medicine

## 2020-03-02 ENCOUNTER — Other Ambulatory Visit (HOSPITAL_COMMUNITY): Payer: Medicare Other

## 2020-03-02 DIAGNOSIS — D62 Acute posthemorrhagic anemia: Secondary | ICD-10-CM | POA: Diagnosis not present

## 2020-03-02 DIAGNOSIS — I5032 Chronic diastolic (congestive) heart failure: Secondary | ICD-10-CM | POA: Diagnosis not present

## 2020-03-02 DIAGNOSIS — U071 COVID-19: Secondary | ICD-10-CM | POA: Diagnosis not present

## 2020-03-02 DIAGNOSIS — J9621 Acute and chronic respiratory failure with hypoxia: Secondary | ICD-10-CM | POA: Diagnosis not present

## 2020-03-02 NOTE — Progress Notes (Signed)
Pulmonary Critical Care Medicine [facility] [place]   PULMONARY CRITICAL CARE SERVICE  PROGRESS NOTE  Date of Service: 03/02/2020  [name]  IEP:[id number]  DOB: [birth year]   DOA: 02/02/2020  Referring Physician: Carron Curie, MD  HPI: [name] is a 79 y.o. female seen for follow up of Acute on Chronic Respiratory Failure.  Patient currently is on the NAG using PMV we should be able to try to do capping trial she looks much better after steroids  Medications: Reviewed on Rounds  Physical Exam:  Vitals: Temperature is 97.4 pulse 87 respiratory 26 blood pressure is 172/87 saturations 98%  Ventilator Settings off ventilator on NAG with PMV  . General: Comfortable at this time . Eyes: Grossly normal lids, irises & conjunctiva . ENT: grossly tongue is normal . Neck: no obvious mass . Cardiovascular: S1 S2 normal no gallop . Respiratory: No rhonchi coarse breath sounds . Abdomen: soft . Skin: no rash seen on limited exam . Musculoskeletal: not rigid . Psychiatric:unable to assess . Neurologic: no seizure no involuntary movements         Lab Data:   Basic Metabolic Panel: Recent Labs  Lab 02/25/20 0506  NA 147*  K 4.4  CL 107  CO2 26  GLUCOSE 148*  BUN 74*  CREATININE 1.00  CALCIUM 9.5    ABG: No results for input(s): PHART, PCO2ART, PO2ART, HCO3, O2SAT in the last 168 hours.  Liver Function Tests: No results for input(s): AST, ALT, ALKPHOS, BILITOT, PROT, ALBUMIN in the last 168 hours. No results for input(s): LIPASE, AMYLASE in the last 168 hours. No results for input(s): AMMONIA in the last 168 hours.  CBC: Recent Labs  Lab 02/25/20 0506  WBC 6.9  HGB 8.3*  HCT 27.8*  MCV 99.3  PLT 200    Cardiac Enzymes: No results for input(s): CKTOTAL, CKMB, CKMBINDEX, TROPONINI in the last 168 hours.  BNP (last 3 results) Recent Labs    02/04/20 1604  BNP 229.5*    ProBNP (last 3 results) No results for  input(s): PROBNP in the last 8760 hours.  Radiological Exams: No results found.  Assessment/Plan Active Problems:   Acute on chronic respiratory failure with hypoxia (HCC)   COVID-19 virus infection   Acute blood loss anemia   Pulmonary fibrosis, postinflammatory (HCC)   Chronic heart failure with preserved ejection fraction (HCC)   1. Acute on chronic respiratory failure hypoxia plan is to continue with advancing the wean try capping trials today 2. COVID-19 virus infection resolved 3. Acute blood loss anemia monitoring labs 4. Pulmonary fibrosis at baseline we will continue supportive care 5. Chronic heart failure preserved ejection fraction continue with present management   I have personally seen and evaluated the patient, evaluated laboratory and imaging results, formulated the assessment and plan and placed orders. The Patient requires high complexity decision making with multiple systems involvement.  Rounds were done with the Respiratory Therapy Director and Staff therapists and discussed with nursing staff also.  Yevonne Pax, MD [facility] Pulmonary Critical Care Medicine Sleep Medicine

## 2020-03-03 DIAGNOSIS — D62 Acute posthemorrhagic anemia: Secondary | ICD-10-CM | POA: Diagnosis not present

## 2020-03-03 DIAGNOSIS — J9621 Acute and chronic respiratory failure with hypoxia: Secondary | ICD-10-CM | POA: Diagnosis not present

## 2020-03-03 DIAGNOSIS — I5032 Chronic diastolic (congestive) heart failure: Secondary | ICD-10-CM | POA: Diagnosis not present

## 2020-03-03 DIAGNOSIS — U071 COVID-19: Secondary | ICD-10-CM | POA: Diagnosis not present

## 2020-03-03 NOTE — Progress Notes (Addendum)
Pulmonary Critical Care Medicine [facility] [place]   PULMONARY CRITICAL CARE SERVICE  PROGRESS NOTE  Date of Service: 03/03/2020  Amanda Steuart  YIF:[id number]  DOB: [birth year]   DOA: 02/02/2020  Referring Physician: Carron Curie, MD  HPI: Kashari Chalmers is a 79 y.o. female seen for follow up of Acute on Chronic Respiratory Failure.  Patient remains on room air NAG failed capping today we will continue to attempt in the future.  Satting well no distress.  Medications: Reviewed on Rounds  Physical Exam:  Vitals: Pulse 97 respirations 22 BP 129/74 O2 sat 100% temp 97.3  Ventilator Settings room air  . General: Comfortable at this time . Eyes: Grossly normal lids, irises & conjunctiva . ENT: grossly tongue is normal . Neck: no obvious mass . Cardiovascular: S1 S2 normal no gallop . Respiratory: No rales or rhonchi noted . Abdomen: soft . Skin: no rash seen on limited exam . Musculoskeletal: not rigid . Psychiatric:unable to assess . Neurologic: no seizure no involuntary movements         Lab Data:   Basic Metabolic Panel: No results for input(s): NA, K, CL, CO2, GLUCOSE, BUN, CREATININE, CALCIUM, MG, PHOS in the last 168 hours.  ABG: No results for input(s): PHART, PCO2ART, PO2ART, HCO3, O2SAT in the last 168 hours.  Liver Function Tests: No results for input(s): AST, ALT, ALKPHOS, BILITOT, PROT, ALBUMIN in the last 168 hours. No results for input(s): LIPASE, AMYLASE in the last 168 hours. No results for input(s): AMMONIA in the last 168 hours.  CBC: No results for input(s): WBC, NEUTROABS, HGB, HCT, MCV, PLT in the last 168 hours.  Cardiac Enzymes: No results for input(s): CKTOTAL, CKMB, CKMBINDEX, TROPONINI in the last 168 hours.  BNP (last 3 results) Recent Labs    02/04/20 1604  BNP 229.5*    ProBNP (last 3 results) No results for input(s): PROBNP in the last 8760 hours.  Radiological Exams: No results  found.  Assessment/Plan Active Problems:   Acute on chronic respiratory failure with hypoxia (HCC)   COVID-19 virus infection   Acute blood loss anemia   Pulmonary fibrosis, postinflammatory (HCC)   Chronic heart failure with preserved ejection fraction (HCC)   1. Acute on chronic respiratory failure hypoxia plan is to continue to attempt capping.  Continue supportive measures and pulmonary toilet. 2. COVID-19 virus infection resolved 3. Acute blood loss anemia monitoring labs 4. Pulmonary fibrosis at baseline we will continue supportive care 5. Chronic heart failure preserved ejection fraction continue with present management   I have personally seen and evaluated the patient, evaluated laboratory and imaging results, formulated the assessment and plan and placed orders. The Patient requires high complexity decision making with multiple systems involvement.  Rounds were done with the Respiratory Therapy Director and Staff therapists and discussed with nursing staff also.  Yevonne Pax, MD [facility] Pulmonary Critical Care Medicine Sleep Medicine

## 2020-03-04 DIAGNOSIS — J9621 Acute and chronic respiratory failure with hypoxia: Secondary | ICD-10-CM | POA: Diagnosis not present

## 2020-03-04 DIAGNOSIS — U071 COVID-19: Secondary | ICD-10-CM | POA: Diagnosis not present

## 2020-03-04 DIAGNOSIS — I5032 Chronic diastolic (congestive) heart failure: Secondary | ICD-10-CM | POA: Diagnosis not present

## 2020-03-04 DIAGNOSIS — D62 Acute posthemorrhagic anemia: Secondary | ICD-10-CM | POA: Diagnosis not present

## 2020-03-04 NOTE — Progress Notes (Signed)
Pulmonary Critical Care Medicine [facility] [place]   PULMONARY CRITICAL CARE SERVICE  PROGRESS NOTE  Date of Service: 03/04/2020  [name]  UXL:[id number]  DOB: 03-30-1941   DOA: 02/02/2020  Referring Physician: Carron Curie, MD  HPI: [name] is a 79 y.o. female seen for follow up of Acute on Chronic Respiratory Failure.  Patient is on the NAG with the PMV in place she was placed on a capping trial and did about 3 hours  Medications: Reviewed on Rounds  Physical Exam:  Vitals: Temperature is 97.2 pulse 69 respiratory rate 21 blood pressure was 119/82 saturations 100%  Ventilator Settings on the NAG on the PMV  . General: Comfortable at this time . Eyes: Grossly normal lids, irises & conjunctiva . ENT: grossly tongue is normal . Neck: no obvious mass . Cardiovascular: S1 S2 normal no gallop . Respiratory: No rhonchi coarse breath sounds . Abdomen: soft . Skin: no rash seen on limited exam . Musculoskeletal: not rigid . Psychiatric:unable to assess . Neurologic: no seizure no involuntary movements         Lab Data:   Basic Metabolic Panel: No results for input(s): NA, K, CL, CO2, GLUCOSE, BUN, CREATININE, CALCIUM, MG, PHOS in the last 168 hours.  ABG: No results for input(s): PHART, PCO2ART, PO2ART, HCO3, O2SAT in the last 168 hours.  Liver Function Tests: No results for input(s): AST, ALT, ALKPHOS, BILITOT, PROT, ALBUMIN in the last 168 hours. No results for input(s): LIPASE, AMYLASE in the last 168 hours. No results for input(s): AMMONIA in the last 168 hours.  CBC: No results for input(s): WBC, NEUTROABS, HGB, HCT, MCV, PLT in the last 168 hours.  Cardiac Enzymes: No results for input(s): CKTOTAL, CKMB, CKMBINDEX, TROPONINI in the last 168 hours.  BNP (last 3 results) Recent Labs    02/04/20 1604  BNP 229.5*    ProBNP (last 3 results) No results for input(s): PROBNP in the last 8760 hours.  Radiological  Exams: No results found.  Assessment/Plan Active Problems:   Acute on chronic respiratory failure with hypoxia (HCC)   COVID-19 virus infection   Acute blood loss anemia   Pulmonary fibrosis, postinflammatory (HCC)   Chronic heart failure with preserved ejection fraction (HCC)   1. Acute on chronic respiratory failure hypoxia we will continue with capping as ordered as tolerated. 2. COVID-19 virus infection treated we will continue to follow along 3. Acute blood loss anemia no change 4. Pulmonary fibrosis at baseline right now we will continue to follow 5. Chronic heart failure preserved ejection fraction continue with supportive care   I have personally seen and evaluated the patient, evaluated laboratory and imaging results, formulated the assessment and plan and placed orders. The Patient requires high complexity decision making with multiple systems involvement.  Rounds were done with the Respiratory Therapy Director and Staff therapists and discussed with nursing staff also.  Yevonne Pax, MD [facility] Pulmonary Critical Care Medicine Sleep Medicine

## 2020-03-05 DIAGNOSIS — J9621 Acute and chronic respiratory failure with hypoxia: Secondary | ICD-10-CM | POA: Diagnosis not present

## 2020-03-05 DIAGNOSIS — D62 Acute posthemorrhagic anemia: Secondary | ICD-10-CM | POA: Diagnosis not present

## 2020-03-05 DIAGNOSIS — U071 COVID-19: Secondary | ICD-10-CM | POA: Diagnosis not present

## 2020-03-05 DIAGNOSIS — I5032 Chronic diastolic (congestive) heart failure: Secondary | ICD-10-CM | POA: Diagnosis not present

## 2020-03-05 NOTE — Progress Notes (Signed)
Pulmonary Critical Care Medicine [facility] [place]   PULMONARY CRITICAL CARE SERVICE  PROGRESS NOTE  Date of Service: 03/05/2020  [name]  YTK:[id number]  DOB: 08/08/1941   DOA: 02/02/2020  Referring Physician: Carron Curie, MD  HPI: [name] is a 79 y.o. female seen for follow up of Acute on Chronic Respiratory Failure.  Patient is on the NAG on room air right now having difficulties with tolerating capping.  We may consider D cannulating will discuss further with respiratory therapy and the primary care team  Medications: Reviewed on Rounds  Physical Exam:  Vitals: Temperature is 97.2 pulse 61 respiratory rate 20 blood pressure is 153/86 saturations 98%  Ventilator Settings on the NAG on room air with PMV  . General: Comfortable at this time . Eyes: Grossly normal lids, irises & conjunctiva . ENT: grossly tongue is normal . Neck: no obvious mass . Cardiovascular: S1 S2 normal no gallop . Respiratory: No rhonchi coarse breath sounds are noted . Abdomen: soft . Skin: no rash seen on limited exam . Musculoskeletal: not rigid . Psychiatric:unable to assess . Neurologic: no seizure no involuntary movements         Lab Data:   Basic Metabolic Panel: No results for input(s): NA, K, CL, CO2, GLUCOSE, BUN, CREATININE, CALCIUM, MG, PHOS in the last 168 hours.  ABG: No results for input(s): PHART, PCO2ART, PO2ART, HCO3, O2SAT in the last 168 hours.  Liver Function Tests: No results for input(s): AST, ALT, ALKPHOS, BILITOT, PROT, ALBUMIN in the last 168 hours. No results for input(s): LIPASE, AMYLASE in the last 168 hours. No results for input(s): AMMONIA in the last 168 hours.  CBC: No results for input(s): WBC, NEUTROABS, HGB, HCT, MCV, PLT in the last 168 hours.  Cardiac Enzymes: No results for input(s): CKTOTAL, CKMB, CKMBINDEX, TROPONINI in the last 168 hours.  BNP (last 3 results) Recent Labs    02/04/20 1604  BNP  229.5*    ProBNP (last 3 results) No results for input(s): PROBNP in the last 8760 hours.  Radiological Exams: No results found.  Assessment/Plan Active Problems:   Acute on chronic respiratory failure with hypoxia (HCC)   COVID-19 virus infection   Acute blood loss anemia   Pulmonary fibrosis, postinflammatory (HCC)   Chronic heart failure with preserved ejection fraction (HCC)   1. Acute on chronic respiratory failure with hypoxia patient continues on the NAG has been having some difficulty tolerating capping trials.  We may consider direct decannulation will discuss with primary care team 2. COVID-19 virus infection resolved 3. Acute blood loss anemia resolved 4. Pulmonary fibrosis continue with supportive care will need follow-up after discharge 5. Chronic heart failure appears to be euvolemic we will continue to monitor closely   I have personally seen and evaluated the patient, evaluated laboratory and imaging results, formulated the assessment and plan and placed orders. The Patient requires high complexity decision making with multiple systems involvement.  Rounds were done with the Respiratory Therapy Director and Staff therapists and discussed with nursing staff also.  Yevonne Pax, MD [facility] Pulmonary Critical Care Medicine Sleep Medicine

## 2020-03-06 DIAGNOSIS — I5032 Chronic diastolic (congestive) heart failure: Secondary | ICD-10-CM | POA: Diagnosis not present

## 2020-03-06 DIAGNOSIS — D62 Acute posthemorrhagic anemia: Secondary | ICD-10-CM | POA: Diagnosis not present

## 2020-03-06 DIAGNOSIS — U071 COVID-19: Secondary | ICD-10-CM | POA: Diagnosis not present

## 2020-03-06 DIAGNOSIS — J9621 Acute and chronic respiratory failure with hypoxia: Secondary | ICD-10-CM | POA: Diagnosis not present

## 2020-03-06 NOTE — Progress Notes (Signed)
Pulmonary Critical Care Medicine [facility] [place]   PULMONARY CRITICAL CARE SERVICE  PROGRESS NOTE  Date of Service: 03/06/2020  [name]  [id number]  DOB: 1941/11/07   DOA: 02/02/2020  Referring Physician: Carron Curie, MD  HPI: [name] is a 79 y.o. female seen for follow up of Acute on Chronic Respiratory Failure.  Patient was restarted on capping trials today she is on room air actually tolerating it quite well  Medications: Reviewed on Rounds  Physical Exam:  Vitals: Temperature is 97.7 pulse 77 respiratory rate 35 blood pressure is 164/58 saturations 100%  Ventilator Settings on capping trials room air  . General: Comfortable at this time . Eyes: Grossly normal lids, irises & conjunctiva . ENT: grossly tongue is normal . Neck: no obvious mass . Cardiovascular: S1 S2 normal no gallop . Respiratory: No rhonchi no rales are noted at this time . Abdomen: soft . Skin: no rash seen on limited exam . Musculoskeletal: not rigid . Psychiatric:unable to assess . Neurologic: no seizure no involuntary movements         Lab Data:   Basic Metabolic Panel: No results for input(s): NA, K, CL, CO2, GLUCOSE, BUN, CREATININE, CALCIUM, MG, PHOS in the last 168 hours.  ABG: No results for input(s): PHART, PCO2ART, PO2ART, HCO3, O2SAT in the last 168 hours.  Liver Function Tests: No results for input(s): AST, ALT, ALKPHOS, BILITOT, PROT, ALBUMIN in the last 168 hours. No results for input(s): LIPASE, AMYLASE in the last 168 hours. No results for input(s): AMMONIA in the last 168 hours.  CBC: No results for input(s): WBC, NEUTROABS, HGB, HCT, MCV, PLT in the last 168 hours.  Cardiac Enzymes: No results for input(s): CKTOTAL, CKMB, CKMBINDEX, TROPONINI in the last 168 hours.  BNP (last 3 results) Recent Labs    02/04/20 1604  BNP 229.5*    ProBNP (last 3 results) No results for input(s): PROBNP in the last 8760  hours.  Radiological Exams: No results found.  Assessment/Plan Active Problems:   Acute on chronic respiratory failure with hypoxia (HCC)   COVID-19 virus infection   Acute blood loss anemia   Pulmonary fibrosis, postinflammatory (HCC)   Chronic heart failure with preserved ejection fraction (HCC)   1. Acute on chronic respiratory failure hypoxia we will continue with capping trials titrate oxygen continue pulmonary toilet. 2. COVID-19 virus infection in resolution phase we will continue to follow 3. Acute blood loss anemia no change we will continue with supportive care 4. Pulmonary fibrosis postinflammatory no change at this time we will continue to follow 5. Chronic heart failure preserved ejection fraction continue with supportive care   I have personally seen and evaluated the patient, evaluated laboratory and imaging results, formulated the assessment and plan and placed orders. The Patient requires high complexity decision making with multiple systems involvement.  Rounds were done with the Respiratory Therapy Director and Staff therapists and discussed with nursing staff also.  Yevonne Pax, MD [facility] Pulmonary Critical Care Medicine Sleep Medicine

## 2020-03-07 DIAGNOSIS — I5032 Chronic diastolic (congestive) heart failure: Secondary | ICD-10-CM | POA: Diagnosis not present

## 2020-03-07 DIAGNOSIS — J9621 Acute and chronic respiratory failure with hypoxia: Secondary | ICD-10-CM | POA: Diagnosis not present

## 2020-03-07 DIAGNOSIS — U071 COVID-19: Secondary | ICD-10-CM | POA: Diagnosis not present

## 2020-03-07 DIAGNOSIS — D62 Acute posthemorrhagic anemia: Secondary | ICD-10-CM | POA: Diagnosis not present

## 2020-03-07 LAB — CBC
HCT: 28.9 % — ABNORMAL LOW (ref 36.0–46.0)
Hemoglobin: 9.1 g/dL — ABNORMAL LOW (ref 12.0–15.0)
MCH: 29.9 pg (ref 26.0–34.0)
MCHC: 31.5 g/dL (ref 30.0–36.0)
MCV: 95.1 fL (ref 80.0–100.0)
Platelets: 268 10*3/uL (ref 150–400)
RBC: 3.04 MIL/uL — ABNORMAL LOW (ref 3.87–5.11)
RDW: 18.6 % — ABNORMAL HIGH (ref 11.5–15.5)
WBC: 11 10*3/uL — ABNORMAL HIGH (ref 4.0–10.5)
nRBC: 0.4 % — ABNORMAL HIGH (ref 0.0–0.2)

## 2020-03-07 LAB — BASIC METABOLIC PANEL
Anion gap: 11 (ref 5–15)
BUN: 42 mg/dL — ABNORMAL HIGH (ref 8–23)
CO2: 21 mmol/L — ABNORMAL LOW (ref 22–32)
Calcium: 8.8 mg/dL — ABNORMAL LOW (ref 8.9–10.3)
Chloride: 102 mmol/L (ref 98–111)
Creatinine, Ser: 1 mg/dL (ref 0.44–1.00)
GFR calc Af Amer: >60 mL/min (ref >60)
GFR calc non Af Amer: 55 mL/min — ABNORMAL LOW (ref >60)
Glucose, Bld: 169 mg/dL — ABNORMAL HIGH (ref 70–99)
Potassium: 4.5 mmol/L (ref 3.5–5.1)
Sodium: 134 mmol/L — ABNORMAL LOW (ref 135–145)

## 2020-03-07 NOTE — Progress Notes (Signed)
Pulmonary Critical Care Medicine [facility] [place]   PULMONARY CRITICAL CARE SERVICE  PROGRESS NOTE  Date of Service: 03/07/2020  [name]  [id number]:[id number]  DOB: 01/17/1941   DOA: 02/02/2020  Referring Physician: Carron Curie, MD  HPI: [name] is a 79 y.o. female seen for follow up of Acute on Chronic Respiratory Failure.  Patient currently is on capping has been doing very well today will be completing 48 hours  Medications: Reviewed on Rounds  Physical Exam:  Vitals: Temperature is 96.6 pulse 63 respiratory rate 20 blood pressure is 145/79 saturations 100%  Ventilator Settings capping on room air  . General: Comfortable at this time . Eyes: Grossly normal lids, irises & conjunctiva . ENT: grossly tongue is normal . Neck: no obvious mass . Cardiovascular: S1 S2 normal no gallop . Respiratory: No rhonchi no rales . Abdomen: soft . Skin: no rash seen on limited exam . Musculoskeletal: not rigid . Psychiatric:unable to assess . Neurologic: no seizure no involuntary movements         Lab Data:   Basic Metabolic Panel: Recent Labs  Lab 03/07/20 0551  NA 134*  K 4.5  CL 102  CO2 21*  GLUCOSE 169*  BUN 42*  CREATININE 1.00  CALCIUM 8.8*    ABG: No results for input(s): PHART, PCO2ART, PO2ART, HCO3, O2SAT in the last 168 hours.  Liver Function Tests: No results for input(s): AST, ALT, ALKPHOS, BILITOT, PROT, ALBUMIN in the last 168 hours. No results for input(s): LIPASE, AMYLASE in the last 168 hours. No results for input(s): AMMONIA in the last 168 hours.  CBC: Recent Labs  Lab 03/07/20 0551  WBC 11.0*  HGB 9.1*  HCT 28.9*  MCV 95.1  PLT 268    Cardiac Enzymes: No results for input(s): CKTOTAL, CKMB, CKMBINDEX, TROPONINI in the last 168 hours.  BNP (last 3 results) Recent Labs    02/04/20 1604  BNP 229.5*    ProBNP (last 3 results) No results for input(s): PROBNP in the last 8760  hours.  Radiological Exams: No results found.  Assessment/Plan Active Problems:   Acute on chronic respiratory failure with hypoxia (HCC)   COVID-19 virus infection   Acute blood loss anemia   Pulmonary fibrosis, postinflammatory (HCC)   Chronic heart failure with preserved ejection fraction (HCC)   1. Acute on chronic respiratory failure with hypoxia patient is doing well with capping today completing 48 hours.  As previously indicated she does have a history of sleep disordered breathing and will be starting her on BiPAP at nighttime 12/8 while asleep.  Tomorrow I think we should be able to decannulate 2. COVID-19 virus infection resolved 3. Acute blood loss anemia stable at this time 4. Pulmonary fibrosis we will monitor needs follow-up with pulmonary after discharge 5. Chronic heart failure preserved ejection fraction monitor fluid status   I have personally seen and evaluated the patient, evaluated laboratory and imaging results, formulated the assessment and plan and placed orders. The Patient requires high complexity decision making with multiple systems involvement.  Rounds were done with the Respiratory Therapy Director and Staff therapists and discussed with nursing staff also.  Yevonne Pax, MD [facility] Pulmonary Critical Care Medicine Sleep Medicine

## 2020-03-08 DIAGNOSIS — J9621 Acute and chronic respiratory failure with hypoxia: Secondary | ICD-10-CM | POA: Diagnosis not present

## 2020-03-08 DIAGNOSIS — I5032 Chronic diastolic (congestive) heart failure: Secondary | ICD-10-CM | POA: Diagnosis not present

## 2020-03-08 DIAGNOSIS — D62 Acute posthemorrhagic anemia: Secondary | ICD-10-CM | POA: Diagnosis not present

## 2020-03-08 DIAGNOSIS — U071 COVID-19: Secondary | ICD-10-CM | POA: Diagnosis not present

## 2020-03-08 NOTE — Progress Notes (Addendum)
Pulmonary Critical Care Medicine [facility] [place]   PULMONARY CRITICAL CARE SERVICE  PROGRESS NOTE  Date of Service: 03/08/2020  [name]  BZM:[id number]  DOB: 31-Oct-1941   DOA: 02/02/2020  Referring Physician: Carron Curie, MD  HPI: [name] is a 79 y.o. female seen for follow up of Acute on Chronic Respiratory Failure.  Patient is now been capped for 72 hours has no secretions and is on room air.  Patient will be decannulated today.  Medications: Reviewed on Rounds  Physical Exam:  Vitals: Pulse 57 respirations 18 BP 100/56 O2 sat 100% temp 97.4  Ventilator Settings room air  . General: Comfortable at this time . Eyes: Grossly normal lids, irises & conjunctiva . ENT: grossly tongue is normal . Neck: no obvious mass . Cardiovascular: S1 S2 normal no gallop . Respiratory: No rales or rhonchi noted . Abdomen: soft . Skin: no rash seen on limited exam . Musculoskeletal: not rigid . Psychiatric:unable to assess . Neurologic: no seizure no involuntary movements         Lab Data:   Basic Metabolic Panel: Recent Labs  Lab 03/07/20 0551  NA 134*  K 4.5  CL 102  CO2 21*  GLUCOSE 169*  BUN 42*  CREATININE 1.00  CALCIUM 8.8*    ABG: No results for input(s): PHART, PCO2ART, PO2ART, HCO3, O2SAT in the last 168 hours.  Liver Function Tests: No results for input(s): AST, ALT, ALKPHOS, BILITOT, PROT, ALBUMIN in the last 168 hours. No results for input(s): LIPASE, AMYLASE in the last 168 hours. No results for input(s): AMMONIA in the last 168 hours.  CBC: Recent Labs  Lab 03/07/20 0551  WBC 11.0*  HGB 9.1*  HCT 28.9*  MCV 95.1  PLT 268    Cardiac Enzymes: No results for input(s): CKTOTAL, CKMB, CKMBINDEX, TROPONINI in the last 168 hours.  BNP (last 3 results) Recent Labs    02/04/20 1604  BNP 229.5*    ProBNP (last 3 results) No results for input(s): PROBNP in the last 8760 hours.  Radiological Exams: No  results found.  Assessment/Plan Active Problems:   Acute on chronic respiratory failure with hypoxia (HCC)   COVID-19 virus infection   Acute blood loss anemia   Pulmonary fibrosis, postinflammatory (HCC)   Chronic heart failure with preserved ejection fraction (HCC)   1. Acute on chronic respiratory failure with hypoxia patient is doing well with capping today completing 72 hours.    Order to decannulate patient today we will continue supportive measures at this time 2. COVID-19 virus infection resolved 3. Acute blood loss anemia stable at this time 4. Pulmonary fibrosis we will monitor needs follow-up with pulmonary after discharge 5. Chronic heart failure preserved ejection fraction monitor fluid status   I have personally seen and evaluated the patient, evaluated laboratory and imaging results, formulated the assessment and plan and placed orders. The Patient requires high complexity decision making with multiple systems involvement.  Rounds were done with the Respiratory Therapy Director and Staff therapists and discussed with nursing staff also.  Yevonne Pax, MD [facility] Pulmonary Critical Care Medicine Sleep Medicine

## 2020-03-09 DIAGNOSIS — U071 COVID-19: Secondary | ICD-10-CM | POA: Diagnosis not present

## 2020-03-09 DIAGNOSIS — J9621 Acute and chronic respiratory failure with hypoxia: Secondary | ICD-10-CM | POA: Diagnosis not present

## 2020-03-09 DIAGNOSIS — I5032 Chronic diastolic (congestive) heart failure: Secondary | ICD-10-CM | POA: Diagnosis not present

## 2020-03-09 DIAGNOSIS — D62 Acute posthemorrhagic anemia: Secondary | ICD-10-CM | POA: Diagnosis not present

## 2020-03-09 NOTE — Progress Notes (Signed)
Pulmonary Critical Care Medicine [facility] [place]   PULMONARY CRITICAL CARE SERVICE  PROGRESS NOTE  Date of Service: 03/09/2020  [name]  [id number]:[id number]  DOB: [birth year]   DOA: 02/02/2020  Referring Physician: Carron Curie, MD  HPI: [name] is a 79 y.o. female seen for follow up of Acute on Chronic Respiratory Failure.  Patient at this time is comfortable right now without distress she is decannulated doing well tolerating the positive airway pressure  Medications: Reviewed on Rounds  Physical Exam:  Vitals: Temperature 98.7 pulse 58 respiratory rate 18 blood pressure is 143/79 saturations 100%  Ventilator Settings decannulated  . General: Comfortable at this time . Eyes: Grossly normal lids, irises & conjunctiva . ENT: grossly tongue is normal . Neck: no obvious mass . Cardiovascular: S1 S2 normal no gallop . Respiratory: No rhonchi no rales are noted . Abdomen: soft . Skin: no rash seen on limited exam . Musculoskeletal: not rigid . Psychiatric:unable to assess . Neurologic: no seizure no involuntary movements         Lab Data:   Basic Metabolic Panel: Recent Labs  Lab 03/07/20 0551  NA 134*  K 4.5  CL 102  CO2 21*  GLUCOSE 169*  BUN 42*  CREATININE 1.00  CALCIUM 8.8*    ABG: No results for input(s): PHART, PCO2ART, PO2ART, HCO3, O2SAT in the last 168 hours.  Liver Function Tests: No results for input(s): AST, ALT, ALKPHOS, BILITOT, PROT, ALBUMIN in the last 168 hours. No results for input(s): LIPASE, AMYLASE in the last 168 hours. No results for input(s): AMMONIA in the last 168 hours.  CBC: Recent Labs  Lab 03/07/20 0551  WBC 11.0*  HGB 9.1*  HCT 28.9*  MCV 95.1  PLT 268    Cardiac Enzymes: No results for input(s): CKTOTAL, CKMB, CKMBINDEX, TROPONINI in the last 168 hours.  BNP (last 3 results) Recent Labs    02/04/20 1604  BNP 229.5*    ProBNP (last 3 results) No results for input(s):  PROBNP in the last 8760 hours.  Radiological Exams: No results found.  Assessment/Plan Active Problems:   Acute on chronic respiratory failure with hypoxia (HCC)   COVID-19 virus infection   Acute blood loss anemia   Pulmonary fibrosis, postinflammatory (HCC)   Chronic heart failure with preserved ejection fraction (HCC)   1. Acute on chronic respiratory failure hypoxia plan is to continue with supportive care oxygen therapy as tolerated and use the BiPAP at nighttime during sleep. 2. COVID-19 virus infection resolved 3. Acute blood loss anemia no change 4. Pulmonary fibrosis stable at this time Continue to need follow-up after discharge 5. Chronic heart failure compensated we will continue with present management   I have personally seen and evaluated the patient, evaluated laboratory and imaging results, formulated the assessment and plan and placed orders. The Patient requires high complexity decision making with multiple systems involvement.  Rounds were done with the Respiratory Therapy Director and Staff therapists and discussed with nursing staff also.  Yevonne Pax, MD [facility] Pulmonary Critical Care Medicine Sleep Medicine

## 2020-03-10 DIAGNOSIS — J9621 Acute and chronic respiratory failure with hypoxia: Secondary | ICD-10-CM | POA: Diagnosis not present

## 2020-03-10 DIAGNOSIS — D62 Acute posthemorrhagic anemia: Secondary | ICD-10-CM | POA: Diagnosis not present

## 2020-03-10 DIAGNOSIS — I5032 Chronic diastolic (congestive) heart failure: Secondary | ICD-10-CM | POA: Diagnosis not present

## 2020-03-10 DIAGNOSIS — U071 COVID-19: Secondary | ICD-10-CM | POA: Diagnosis not present

## 2020-03-10 NOTE — Progress Notes (Addendum)
Pulmonary Critical Care Medicine [facility] [place]   PULMONARY CRITICAL CARE SERVICE  PROGRESS NOTE  Date of Service: 03/10/2020  [name]  NUU:[id number]  DOB: [birth year]   DOA: 02/02/2020  Referring Physician: Carron Curie, MD  HPI: [name] is a 79 y.o. female seen for follow up of Acute on Chronic Respiratory Failure.  Patient is resting comfortably on room air was decannulated yesterday.  Satting well at this time  Medications: Reviewed on Rounds  Physical Exam:  Vitals: Pulse 55 respirations 16 BP 143/65 O2 sat 100% temp 96.9  Ventilator Settings room air  . General: Comfortable at this time . Eyes: Grossly normal lids, irises & conjunctiva . ENT: grossly tongue is normal . Neck: no obvious mass . Cardiovascular: S1 S2 normal no gallop . Respiratory: No rales or rhonchi noted . Abdomen: soft . Skin: no rash seen on limited exam . Musculoskeletal: not rigid . Psychiatric:unable to assess . Neurologic: no seizure no involuntary movements         Lab Data:   Basic Metabolic Panel: Recent Labs  Lab 03/07/20 0551  NA 134*  K 4.5  CL 102  CO2 21*  GLUCOSE 169*  BUN 42*  CREATININE 1.00  CALCIUM 8.8*    ABG: No results for input(s): PHART, PCO2ART, PO2ART, HCO3, O2SAT in the last 168 hours.  Liver Function Tests: No results for input(s): AST, ALT, ALKPHOS, BILITOT, PROT, ALBUMIN in the last 168 hours. No results for input(s): LIPASE, AMYLASE in the last 168 hours. No results for input(s): AMMONIA in the last 168 hours.  CBC: Recent Labs  Lab 03/07/20 0551  WBC 11.0*  HGB 9.1*  HCT 28.9*  MCV 95.1  PLT 268    Cardiac Enzymes: No results for input(s): CKTOTAL, CKMB, CKMBINDEX, TROPONINI in the last 168 hours.  BNP (last 3 results) Recent Labs    02/04/20 1604  BNP 229.5*    ProBNP (last 3 results) No results for input(s): PROBNP in the last 8760 hours.  Radiological Exams: No results  found.  Assessment/Plan Active Problems:   Acute on chronic respiratory failure with hypoxia (HCC)   COVID-19 virus infection   Acute blood loss anemia   Pulmonary fibrosis, postinflammatory (HCC)   Chronic heart failure with preserved ejection fraction (HCC)   1. Acute on chronic respiratory failure hypoxia plan is to continue BiPAP at night room air during the day.  Tentative plans to discharge tomorrow.  Continue supportive measures. 2. COVID-19 virus infection resolved 3. Acute blood loss anemia no change 4. Pulmonary fibrosis stable at this time Continue to need follow-up after discharge 5. Chronic heart failure compensated we will continue with present management   I have personally seen and evaluated the patient, evaluated laboratory and imaging results, formulated the assessment and plan and placed orders. The Patient requires high complexity decision making with multiple systems involvement.  Rounds were done with the Respiratory Therapy Director and Staff therapists and discussed with nursing staff also.  Yevonne Pax, MD [facility] Pulmonary Critical Care Medicine Sleep Medicine

## 2020-03-10 NOTE — Progress Notes (Addendum)
IR aware of request for gastrostomy placement, as noted patient is approved for procedure however we are still waiting for shipment of gastrostomy tubes to arrive. We will notify primary team once tubes have arrived to plan for procedure.   Please call IR with questions or concerns.  Michaelanthony Kempton, PA-C 

## 2020-03-13 LAB — NOVEL CORONAVIRUS, NAA (HOSP ORDER, SEND-OUT TO REF LAB; TAT 18-24 HRS): SARS-CoV-2, NAA: NOT DETECTED

## 2020-09-16 IMAGING — US US RENAL
1 series · 14 of 24 positions shown · non-contrast
Comparison: CT scan earlier today.

CLINICAL DATA: Possible 2.2 cm left renal lesion on noncontrast CT.

EXAM:
RENAL / URINARY TRACT ULTRASOUND COMPLETE

[Series 1: us renal · 14 of 24 slices shown]
[im 1/24]
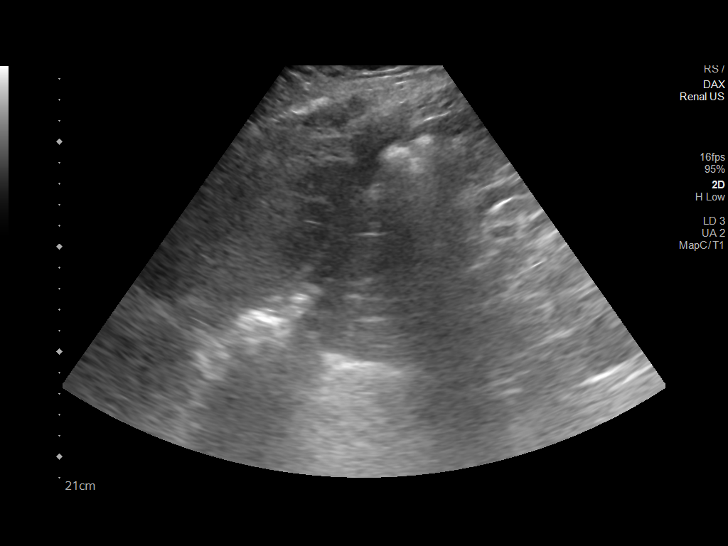
[im 3/24]
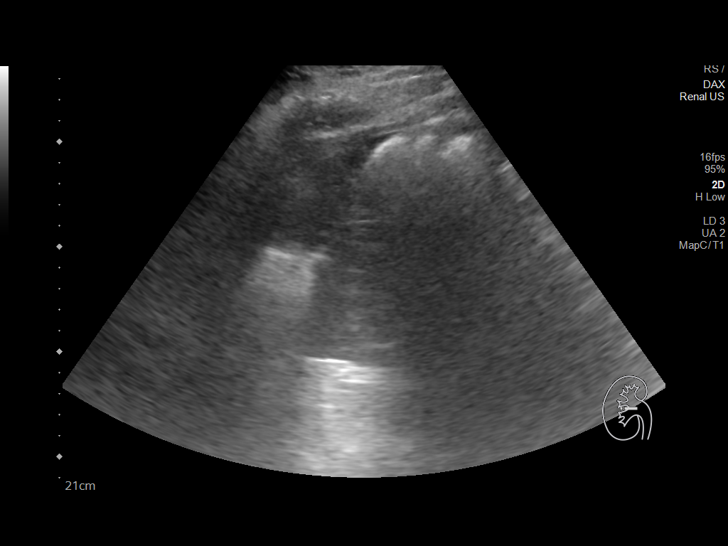
[im 5/24]
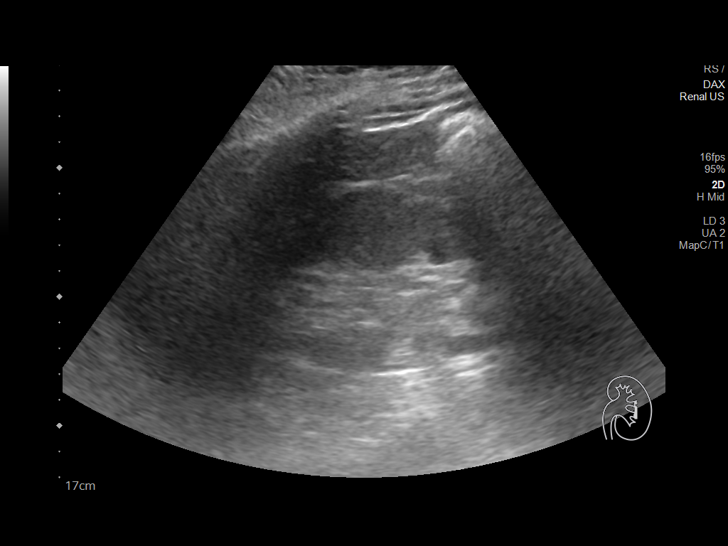
[im 7/24]
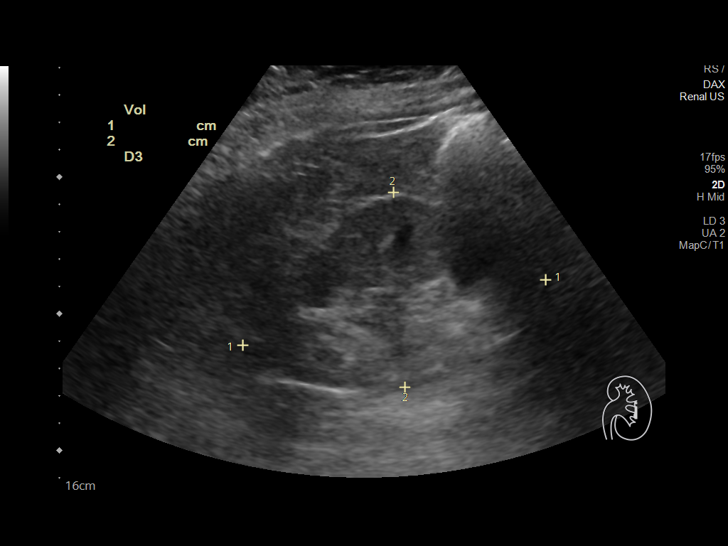
[im 8/24]
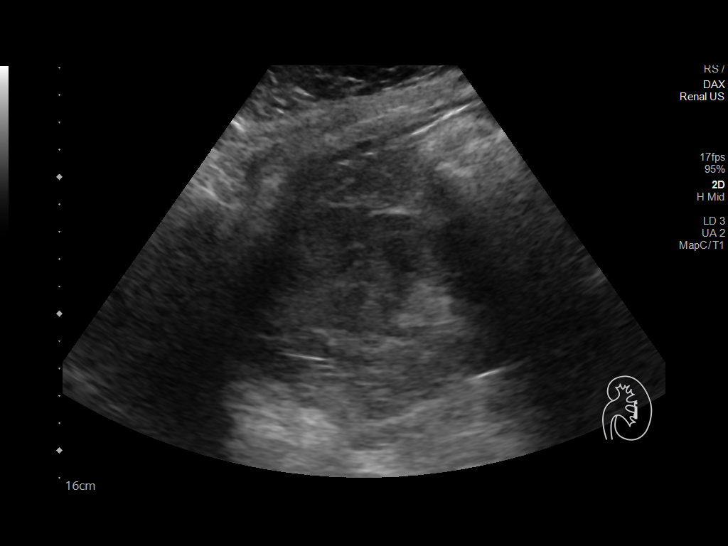
[im 10/24]
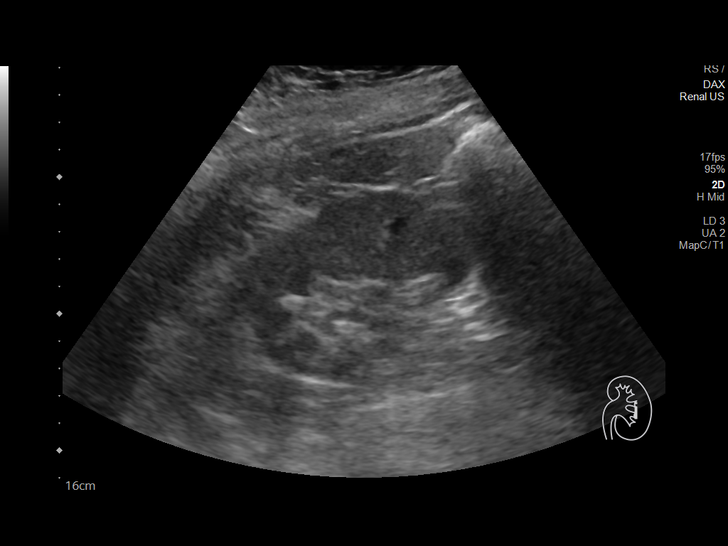
[im 12/24]
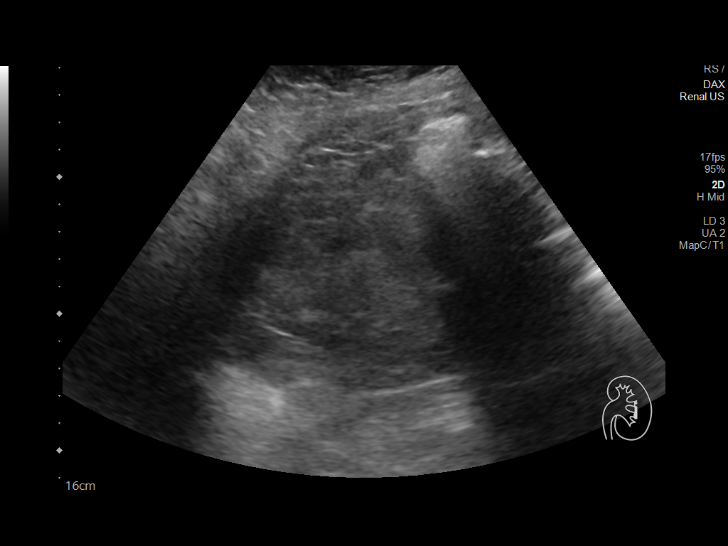
[im 13/24]
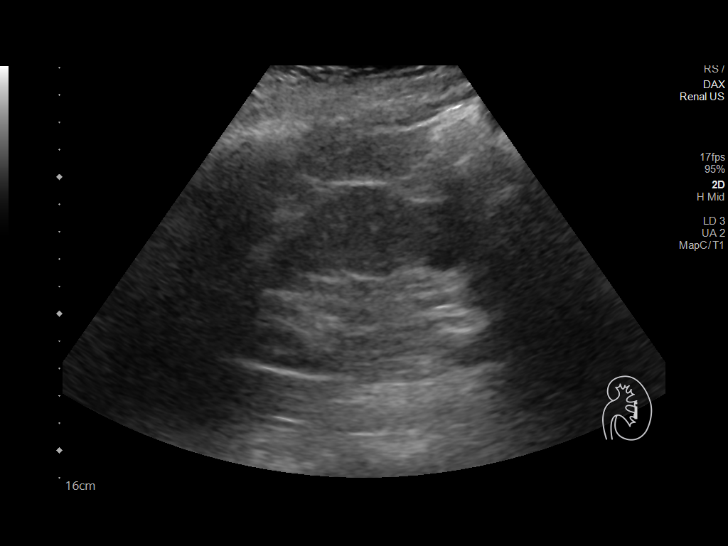
[im 15/24]
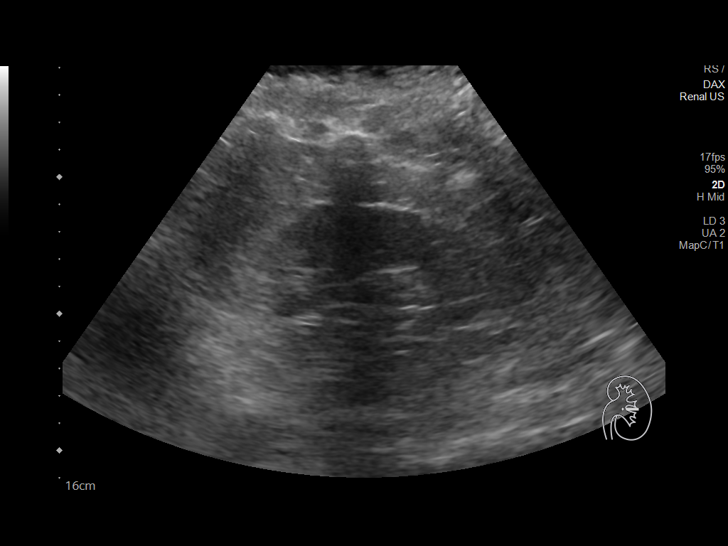
[im 17/24]
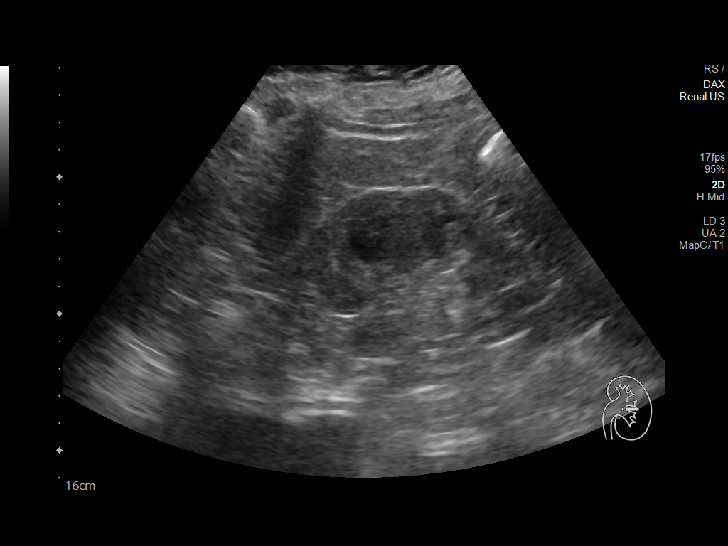
[im 19/24]
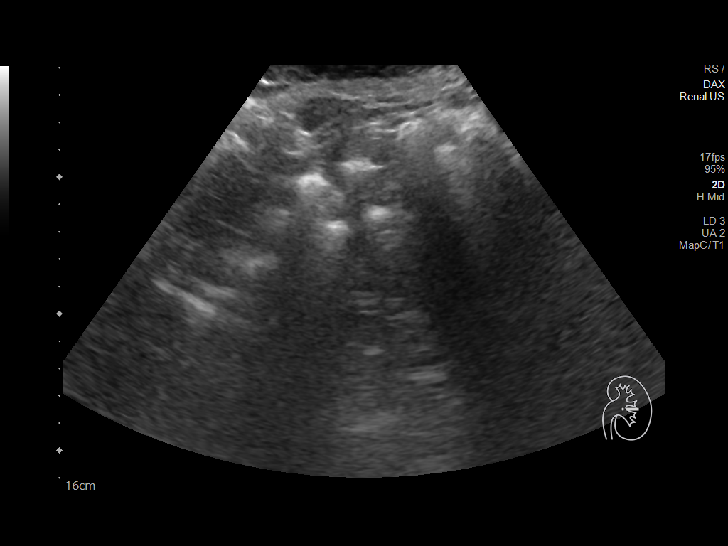
[im 20/24]
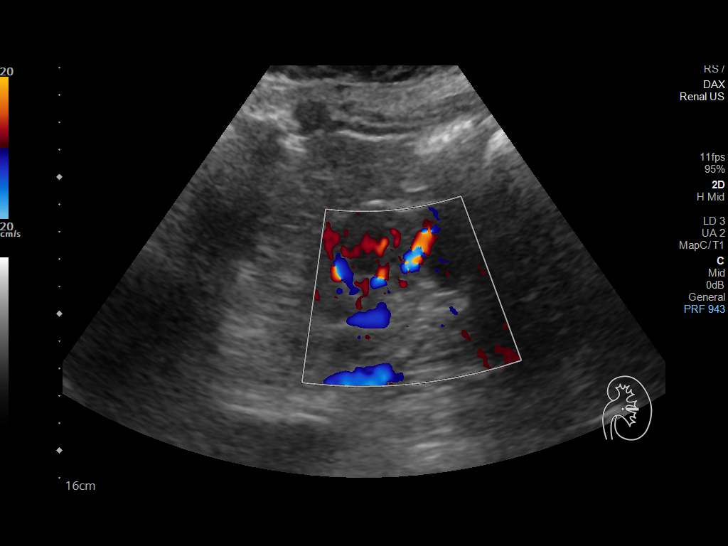
[im 22/24]
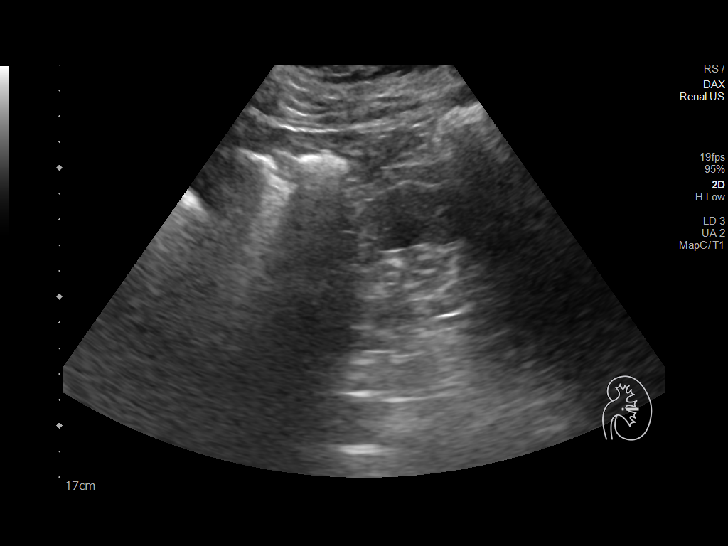
[im 24/24]
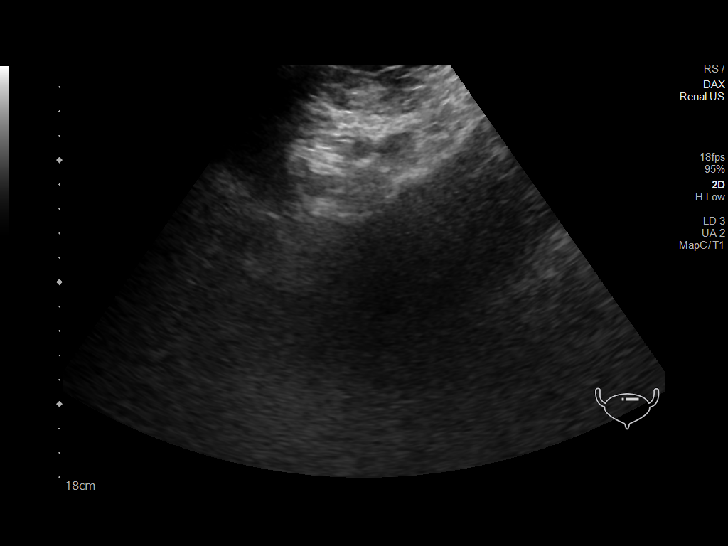

[14 of 24 positions shown; findings below may reference images not displayed]

FINDINGS: Right Kidney:

Surgically absent.

Left Kidney:

Renal measurements: 11.3 x 7.1 x 6.3 cm = volume: 265 mL. Diffusely
increased echogenicity. No hydronephrosis. No discrete mass lesion
evident by ultrasound.

Bladder:

Nonvisualized.

Other:

Study limited by patient body habitus and movement during exam.
IMPRESSION: No definite left renal lesion by ultrasound although today's study
is degraded by patient body habitus and movement. As the abnormality
was visualized on CT, consider follow-up CT in 3 months to assess
stability.

## 2020-09-16 IMAGING — CT CT ABDOMEN W/O CM
2 of 4 series · 14 of 46 positions shown, 16 images · non-contrast
Comparison: Chest CT-02/05/2020

CLINICAL DATA: Evaluate and prior to potential percutaneous
gastrostomy tube placement. History of pulmonary fibrosis.

EXAM:
CT ABDOMEN WITHOUT CONTRAST
TECHNIQUE: Multidetector CT imaging of the abdomen was performed following the
standard protocol without IV contrast.

[Series 3: a/p w/o 5mm · axial · non-contrast · 0.98mm/px · z∈[+1074,+1359]mm · 11 of 65 slices shown, 13 images]
[im 4/65  soft-tissue]
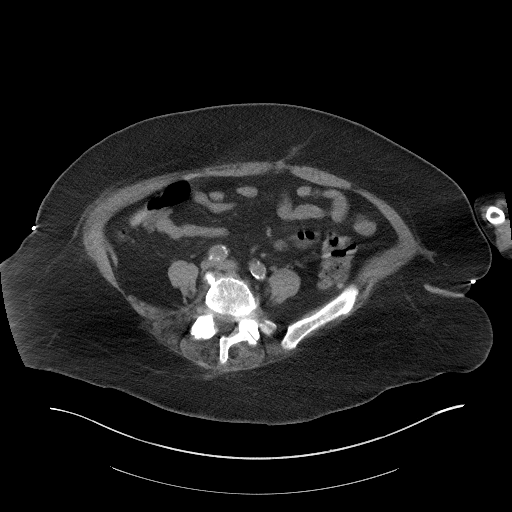
[im 4/65  bone]
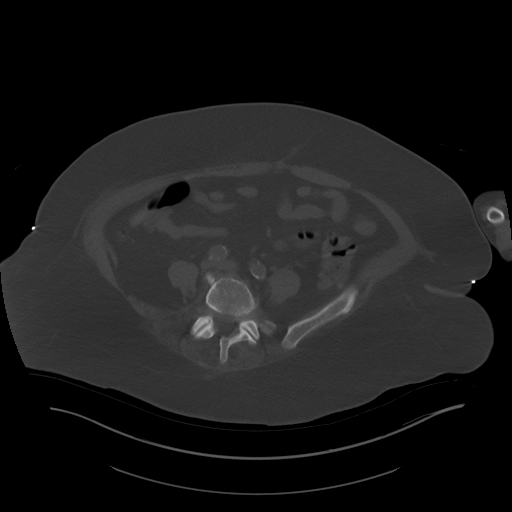
[im 10/65  soft-tissue]
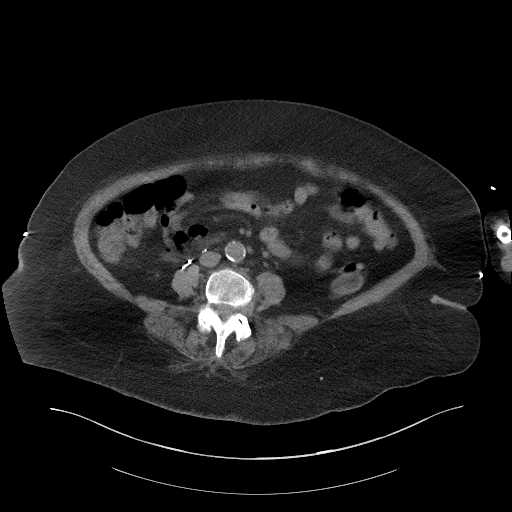
[im 16/65  soft-tissue]
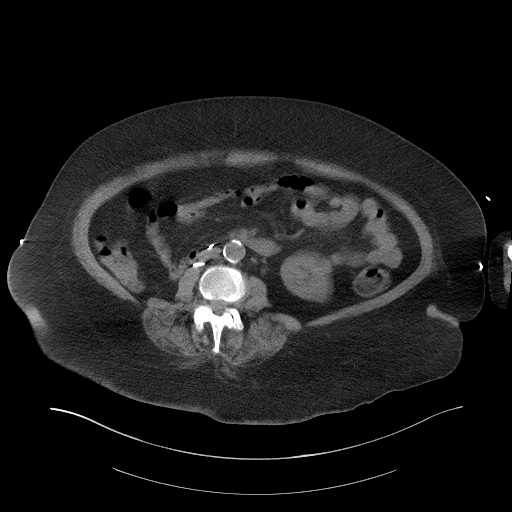
[im 22/65  soft-tissue]
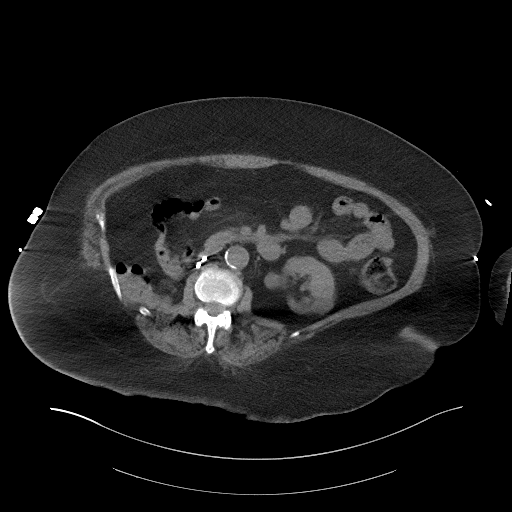
[im 28/65  soft-tissue]
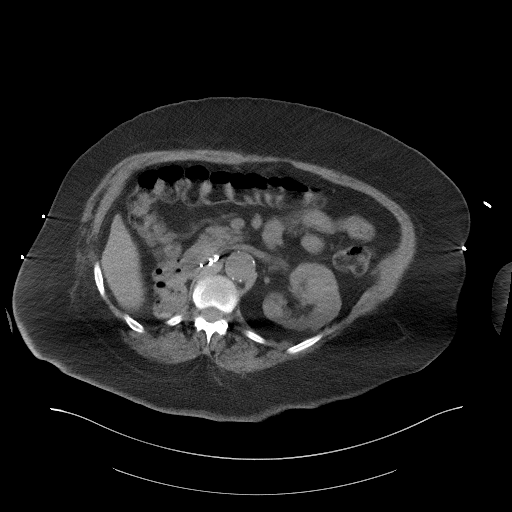
[im 34/65  soft-tissue]
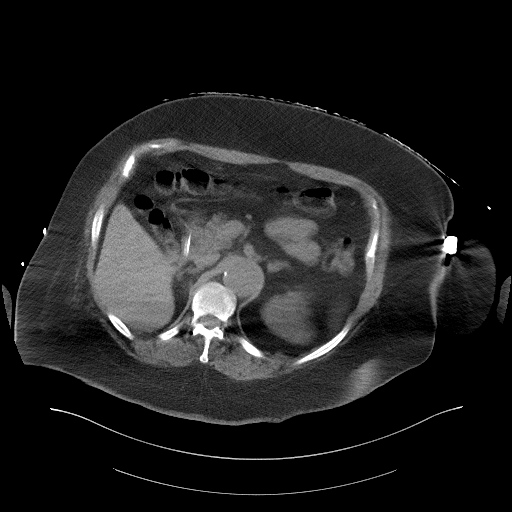
[im 37/65  soft-tissue]
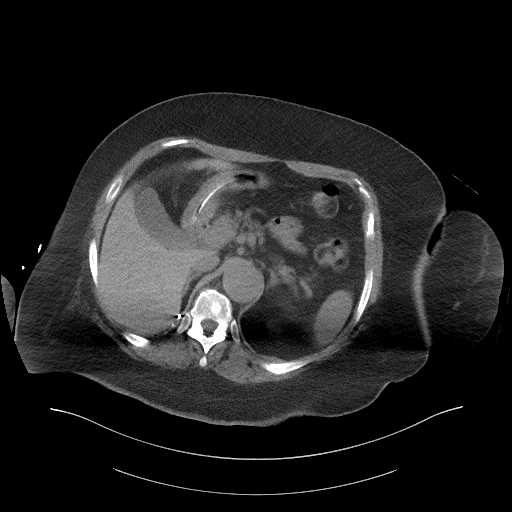
[im 43/65  soft-tissue]
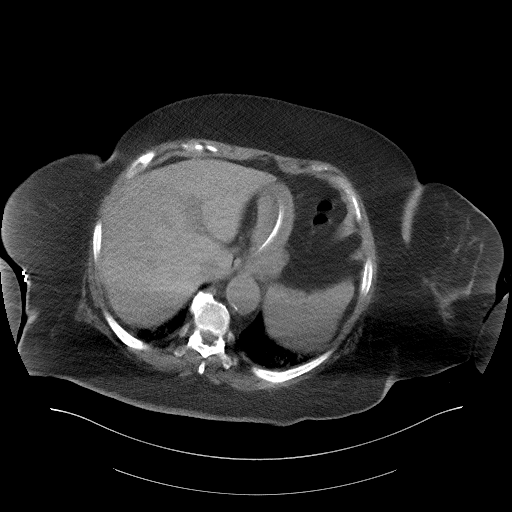
[im 49/65  soft-tissue]
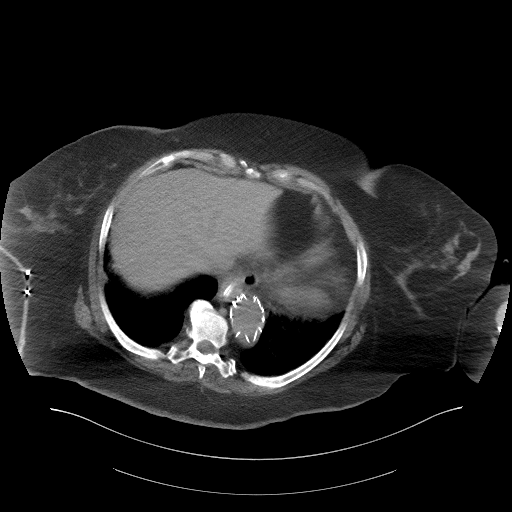
[im 49/65  bone]
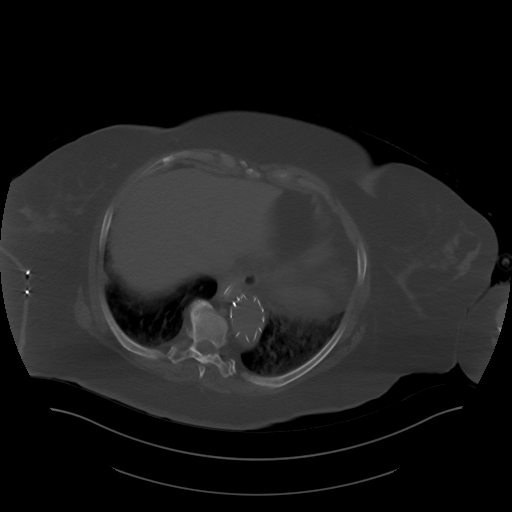
[im 55/65  soft-tissue]
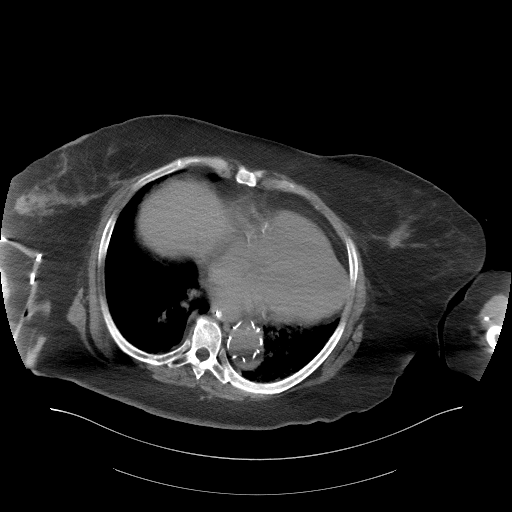
[im 61/65  soft-tissue]
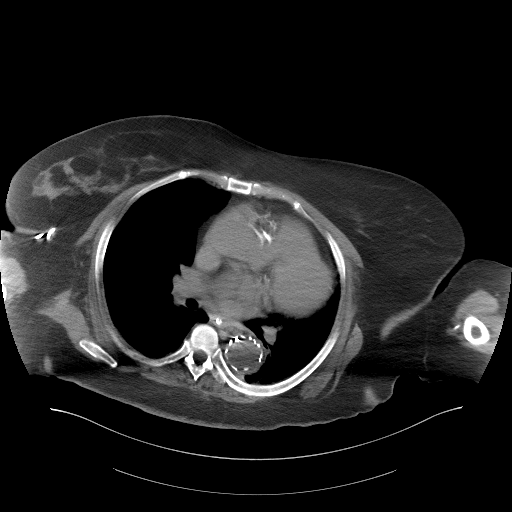

[Series 6: a/p w/o cor · coronal · non-contrast · 0.63mm/px · 3 of 151 slices shown]
[im 51/151  soft-tissue]
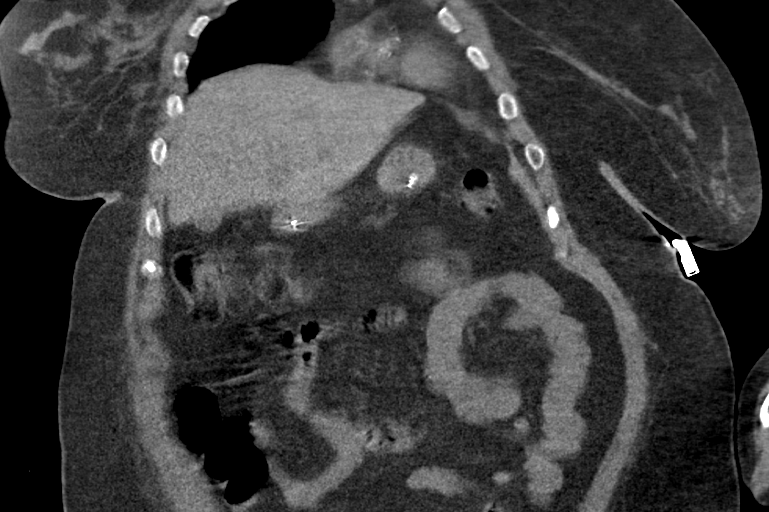
[im 67/151  soft-tissue]
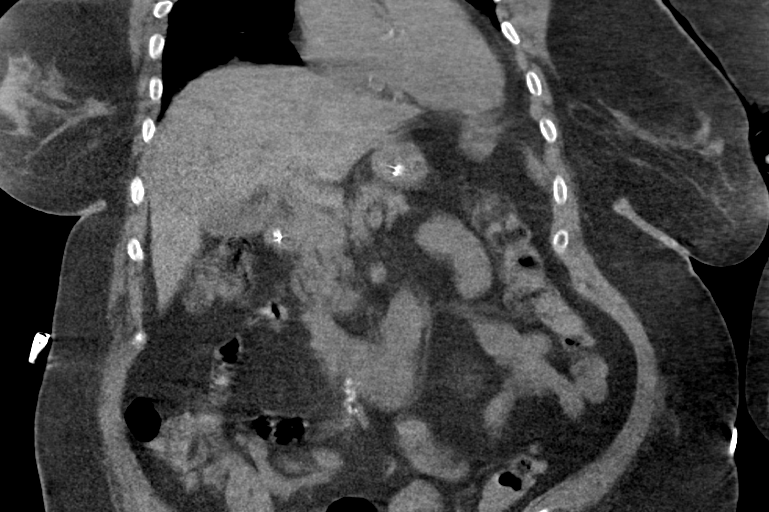
[im 84/151  soft-tissue]
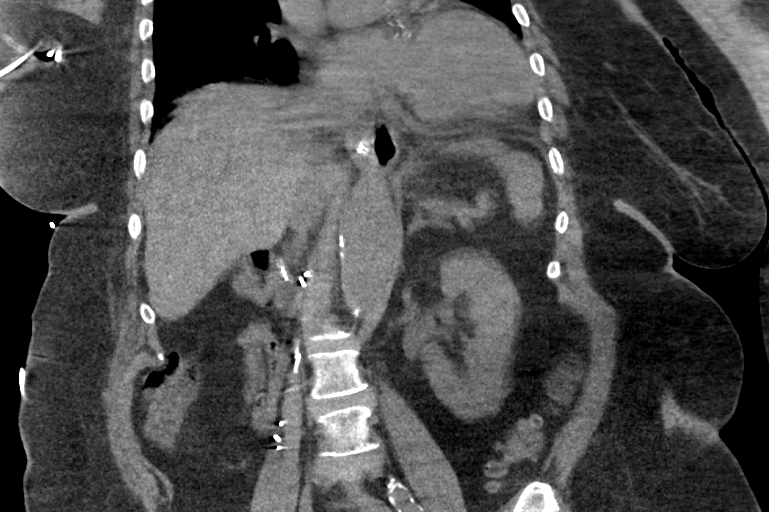

[14 of 46 positions shown; findings below may reference images not displayed]

FINDINGS: Lower chest: Evaluation the lower thorax is degraded secondary to
patient respiratory artifact. Improved aeration of the lungs with
minimal bibasilar interstitial thickening left greater than right.
Minimal dependent subpleural ground-glass atelectasis. No pleural
effusion.

Cardiomegaly. Coronary artery calcifications. No pericardial
effusion.

Hepatobiliary: Normal hepatic contour. Normal noncontrast appearance
of the gallbladder given degree distention. No radiopaque
gallstones. No ascites.

Pancreas: Normal noncontrast appearance of the pancreas.

Spleen: Normal noncontrast appearance of the spleen.

Adrenals/Urinary Tract: Post right-sided nephrectomy and
retroperitoneal lymph node dissection.

Questionable approximately 2.2 x 1.7 cm hypoattenuating lesion
involving the interpolar aspect the left kidney (image 44, series
3), incompletely characterized on this noncontrast examination.
Calcifications about the left renal hilum are vascular in etiology.
No definite renal stones. Note is made of a left-sided gonadal vein
phlebolith. No urinary obstruction or perinephric stranding. Normal
appearance the bilateral adrenal glands. The urinary bladder was not
imaged.

Stomach/Bowel: The stomach is well apposed against the ventral wall
of the upper abdomen without interposition of the hepatic parenchyma
or transverse colon. Enteric tube tip terminates within the proximal
descending duodenum.

Moderate colonic stool burden without evidence of enteric
obstruction. No pneumoperitoneum, pneumatosis or portal venous gas.

Vascular/Lymphatic: Post stent graft repair of the caudal aspect of
the descending thoracic aorta. Atherosclerotic plaque within normal
caliber abdominal aorta.

As above, post right-sided nephrectomy and retroperitoneal lymph
node dissection. No bulky retroperitoneal or mesenteric
lymphadenopathy on this noncontrast examination.

Other: Regional soft tissues appear normal.

Musculoskeletal: No acute or aggressive osseous abnormalities.
Stigmata of dish within the thoracic spine. Moderate DDD of L5-S1
with disc space height loss, endplate irregularity and sclerosis.
IMPRESSION: 1. Gastric anatomy amenable to potential percutaneous gastrostomy
tube placement as indicated.
2. Improved aeration of the imaged lung bases with persistent
interstitial opacities, left greater than right, nonspecific though
compatible with provided history of pulmonary fibrosis.
3. Cardiomegaly.  Coronary artery calcifications.
4. Post endovascular stent repair of the descending thoracic aorta,
incompletely evaluated on this noncontrast examination.
5.  Aortic Atherosclerosis ([id number]).
6. Questioned approximately 2.2 cm hypoattenuating left-sided renal
lesion, incompletely characterized on this noncontrast examination.
Further evaluation with renal ultrasound could be performed as
indicated.
